# Patient Record
Sex: Female | Born: 1980 | Race: White | Hispanic: No | Marital: Married | Smoking: Never smoker
Health system: Southern US, Community
[De-identification: ages and names within clinical notes are randomized; demographics above are authoritative.]

## PROBLEM LIST (undated history)

## (undated) DIAGNOSIS — F419 Anxiety disorder, unspecified: Secondary | ICD-10-CM

## (undated) DIAGNOSIS — K589 Irritable bowel syndrome without diarrhea: Secondary | ICD-10-CM

## (undated) DIAGNOSIS — G43909 Migraine, unspecified, not intractable, without status migrainosus: Secondary | ICD-10-CM

## (undated) DIAGNOSIS — B001 Herpesviral vesicular dermatitis: Secondary | ICD-10-CM

## (undated) HISTORY — PX: WISDOM TOOTH EXTRACTION: SHX21

## (undated) HISTORY — DX: Herpesviral vesicular dermatitis: B00.1

## (undated) HISTORY — DX: Migraine, unspecified, not intractable, without status migrainosus: G43.909

## (undated) HISTORY — DX: Anxiety disorder, unspecified: F41.9

## (undated) HISTORY — DX: Irritable bowel syndrome, unspecified: K58.9

## (undated) HISTORY — PX: AUGMENTATION MAMMAPLASTY: SUR837

## (undated) HISTORY — PX: MOLE REMOVAL: SHX2046

## (undated) HISTORY — PX: OTHER SURGICAL HISTORY: SHX169

---

## 2011-07-19 DIAGNOSIS — F419 Anxiety disorder, unspecified: Secondary | ICD-10-CM | POA: Insufficient documentation

## 2011-11-12 DIAGNOSIS — Z3480 Encounter for supervision of other normal pregnancy, unspecified trimester: Secondary | ICD-10-CM | POA: Insufficient documentation

## 2011-11-12 HISTORY — DX: Encounter for supervision of other normal pregnancy, unspecified trimester: Z34.80

## 2012-05-05 DIAGNOSIS — M25471 Effusion, right ankle: Secondary | ICD-10-CM

## 2012-05-05 DIAGNOSIS — O4290 Premature rupture of membranes, unspecified as to length of time between rupture and onset of labor, unspecified weeks of gestation: Secondary | ICD-10-CM | POA: Insufficient documentation

## 2012-05-05 HISTORY — DX: Premature rupture of membranes, unspecified as to length of time between rupture and onset of labor, unspecified weeks of gestation: O42.90

## 2012-05-05 HISTORY — DX: Effusion, right ankle: M25.471

## 2012-11-27 ENCOUNTER — Ambulatory Visit (INDEPENDENT_AMBULATORY_CARE_PROVIDER_SITE_OTHER): Payer: Managed Care, Other (non HMO) | Admitting: Adult Health

## 2012-11-27 ENCOUNTER — Encounter: Payer: Self-pay | Admitting: Adult Health

## 2012-11-27 VITALS — BP 118/70 | HR 74 | Temp 98.6°F | Resp 14 | Ht 63.0 in | Wt 152.0 lb

## 2012-11-27 DIAGNOSIS — Z23 Encounter for immunization: Secondary | ICD-10-CM

## 2012-11-27 DIAGNOSIS — F411 Generalized anxiety disorder: Secondary | ICD-10-CM | POA: Insufficient documentation

## 2012-11-27 MED ORDER — PAROXETINE HCL 20 MG PO TABS: 20.0000 mg | ORAL_TABLET | Freq: Every day | ORAL | Status: DC

## 2012-11-27 NOTE — Assessment & Plan Note (Signed)
Flu shot given during clinic.

## 2012-11-27 NOTE — Progress Notes (Signed)
Subjective:    Patient ID: Stacey Barnett, female    DOB: 06-26-1980, 32 y.o.   MRN: [id number]  HPI  Patient is a pleasant 32 y/o female who presents to clinic to establish care. She was followed by [facility] previously while living in [facility]. She reports having slight anxiety issues. She used to take Paxil for her symptoms and felt they were well controlled. She stopped taking medication during her pregnancy. She would like to restart this medication.    Past Medical History  Diagnosis Date  . IBS (irritable bowel syndrome)   . Anxiety      Past Surgical History  Procedure Laterality Date  . Mole removal    . Wisdom tooth extraction    . Bunyons       Family History  Problem Relation Age of Onset  . Stroke Maternal Grandmother   . Diabetes Maternal Grandmother   . Stroke Paternal Grandfather   . Cancer Maternal Grandfather 13    prostate cancer  . Heart disease Paternal Grandmother      History   Social History  . Marital Status: Married    Spouse Name: Josh    Number of Children: 3  . Years of Education: 16   Occupational History  . 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 Smoking status: Never Smoker   . Smokeless tobacco: Never Used  . Alcohol Use: Yes     Comment: Occasionally   . Drug Use: No  . Sexual Activity: Yes    Birth Control/ Protection: Pill   Other Topics Concern  . Not on file   Social History Narrative   [name] grew up in [place], [place]. She attended [employer] in [place], [place] where she obtained her Bachelors Degree in [employer]. She current lives in [place] with her husband and 3 daughters (ages 20, 4 and 6 months). [name] is currently staying at home.  She enjoys going to the movies. She enjoys reading to her children.      Review of Systems  Constitutional: Negative.   HENT: Negative.   Eyes: Negative.   Respiratory:  Negative.   Cardiovascular: Negative.   Gastrointestinal: Negative.   Endocrine: Negative.   Genitourinary: Negative.   Musculoskeletal: Negative.   Skin: Negative.   Allergic/Immunologic: Negative.   Neurological: Negative.   Hematological: Negative.   Psychiatric/Behavioral: The patient is nervous/anxious.        Objective:   Physical Exam  Constitutional: She is oriented to person, place, and time. She appears well-developed and well-nourished. No distress.  HENT:  Head: Normocephalic and atraumatic.  Right Ear: External ear normal.  Left Ear: External ear normal.  Nose: Nose normal.  Mouth/Throat: Oropharynx is clear and moist.  Eyes: Conjunctivae and EOM are normal. Pupils are equal, round, and reactive to light.  Neck: Normal range of motion. Neck supple. No tracheal deviation present. No thyromegaly present.  Cardiovascular: Normal rate, regular rhythm, normal heart sounds and intact distal pulses.  Exam reveals no gallop and no friction rub.   No murmur heard. Pulmonary/Chest: Effort normal and breath sounds normal. No respiratory distress. She has no wheezes. She has no rales.  Abdominal: Soft. Bowel sounds are normal. She exhibits no distension and no mass. There is no tenderness. There is no rebound and no guarding.  Musculoskeletal: Normal range of motion. She exhibits no  edema and no tenderness.  Lymphadenopathy:    She has no cervical adenopathy.  Neurological: She is alert and oriented to person, place, and time. She has normal reflexes. No cranial nerve deficit. Coordination normal.  Skin: Skin is warm and dry.  Psychiatric: She has a normal mood and affect. Her behavior is normal. Judgment and thought content normal.    BP 118/70  Pulse 74  Temp(Src) 98.6 F (37 C)  Resp 14  SpO2 98%       Assessment & Plan:

## 2012-11-27 NOTE — Progress Notes (Signed)
Pre visit review using our clinic review tool, if applicable. No additional management support is needed unless otherwise documented below in the visit note. 

## 2012-11-27 NOTE — Assessment & Plan Note (Signed)
Patient has hx of anxiety and has taken paxil in the past with good results. She stopped taking during pregnancy. Now she is ready to restart. Start Paxil 20 mg daily. May increase to 30 mg in 1 week. This was the dose she was previously on.

## 2012-11-27 NOTE — Patient Instructions (Signed)
   Thank you for choosing  at [facility] for your health care needs.  Please have your labs drawn at your earliest convenience.  The results will be available through MyChart for your convenience. Please remember to activate this. The activation code is located at the end of this form.  I will request your medical records from [facility].  Your prescription for Paxil has been sent to [facility].  Please feel free to call with any questions or concerns.

## 2012-12-18 ENCOUNTER — Other Ambulatory Visit: Payer: Self-pay | Admitting: Adult Health

## 2012-12-18 ENCOUNTER — Telehealth: Payer: Self-pay | Admitting: Adult Health

## 2012-12-18 MED ORDER — NORETHINDRONE ACET-ETHINYL EST 1-20 MG-MCG PO TABS: 1.0000 | ORAL_TABLET | Freq: Every day | ORAL | Status: DC

## 2012-12-18 NOTE — Telephone Encounter (Signed)
Sent in prescription for Junel. Called patient and left message to call back in the morning to let me know what dosage of valtrex she was on and how often she takes this.

## 2012-12-18 NOTE — Telephone Encounter (Signed)
Pt says she forgot to bring her birth control when she came. The name is Junel 1-20 generic for lolesterin 1-20 is the name of her birth control. Pt is asking for valtrex for her fever blisters as well. Pt uses [facility] on [address]

## 2012-12-19 ENCOUNTER — Other Ambulatory Visit: Payer: Self-pay | Admitting: Adult Health

## 2012-12-19 MED ORDER — VALACYCLOVIR HCL 1 G PO TABS: ORAL_TABLET | ORAL | Status: DC

## 2012-12-19 NOTE — Telephone Encounter (Signed)
See previous message

## 2012-12-19 NOTE — Telephone Encounter (Signed)
Pt called back stated she does not know the dosage of valtrex and she only uses it once or twice a year when she gets a fever blister

## 2012-12-19 NOTE — Progress Notes (Signed)
Patient advised and states an understanding.

## 2012-12-22 NOTE — Telephone Encounter (Signed)
Pt advised to bring/mail copy of [facility] labs to the office , so that we may scan results in epic, pt states an understanding and seh has enough Paxil for 2-3 more weeks and will let us know when she needs more medication.

## 2012-12-22 NOTE — Telephone Encounter (Signed)
Pt states she has lab work she received from [facility].  Would like nurse to call her so she can verbally give the results.  Would like R. Rey to call in more of the medication she started (no name given).  Pt states it is working well and she would like to continue.

## 2013-02-08 ENCOUNTER — Other Ambulatory Visit: Payer: Self-pay | Admitting: *Deleted

## 2013-02-09 MED ORDER — PAROXETINE HCL 20 MG PO TABS: 20.0000 mg | ORAL_TABLET | Freq: Every day | ORAL | Status: DC

## 2013-02-12 ENCOUNTER — Other Ambulatory Visit: Payer: Self-pay | Admitting: *Deleted

## 2013-02-12 MED ORDER — NORETHINDRONE ACET-ETHINYL EST 1-20 MG-MCG PO TABS: 1.0000 | ORAL_TABLET | Freq: Every day | ORAL | Status: DC

## 2013-04-17 ENCOUNTER — Other Ambulatory Visit: Payer: Self-pay | Admitting: Adult Health

## 2013-05-07 ENCOUNTER — Telehealth: Payer: Self-pay | Admitting: Adult Health

## 2013-05-07 NOTE — Telephone Encounter (Signed)
Appt scheduled with Raquel 05/08/13, just FYI

## 2013-05-07 NOTE — Telephone Encounter (Signed)
Patient Information:  Caller Name: [name]  Phone: [phone]  Patient: Stacey Barnett, Stacey Barnett  Gender: Female  DOB: 04/07/1980  Age: [age] Years  PCP: Orville Governey, Raquel  Pregnant: No  Office Follow Up:  Does the office need to follow up with this patient?: No  Instructions For The Office: N/A  RN Note:  Pt agrees to OV and call back information  Symptoms  Reason For Call & Symptoms: Pt reports that she has a history of IBS.  Pt reports that she was constipated.  She took a Laxative on 4/17.  Pt reports that she was able to pass stool.  Pt is calling about having bright red blood in stool x 3 after having multiple BM's.  Pt reports that blood has since stopped. Pt reports that she does not have any abd pain or rectal pain.  Pt denies any known history hemmroids.  Reviewed Health History In EMR: Yes  Reviewed Medications In EMR: Yes  Reviewed Allergies In EMR: Yes  Reviewed Surgeries / Procedures: Yes  Date of Onset of Symptoms: 05/04/2013 OB / GYN:  LMP: 04/23/2013  Guideline(s) Used:  Constipation  Rectal Bleeding  Disposition Per Guideline:   See Within 2 Weeks in Office  Reason For Disposition Reached:   Rectal bleeding is minimal (e.g., blood just on toilet paper, a few drops in toilet bowl)  Advice Given:  Warm SITZ Baths Twice a Day:   Sit in a warm saline bath for 20 minutes 2 times daily to cleanse the rectal area and to promote healing.  You can add 2 ounces (57 grams) of table salt or baking soda to each tub of water.  Call Back If:  Bleeding increases in amount  You become worse.  Patient Will Follow Care Advice:  YES  Appointment Scheduled:  05/08/2013 16:15:00 Appointment Scheduled Provider:  Orville Governey, Raquel

## 2013-05-08 ENCOUNTER — Encounter: Payer: Self-pay | Admitting: Adult Health

## 2013-05-08 ENCOUNTER — Ambulatory Visit (INDEPENDENT_AMBULATORY_CARE_PROVIDER_SITE_OTHER): Payer: Managed Care, Other (non HMO) | Admitting: Adult Health

## 2013-05-08 VITALS — BP 84/64 | HR 70 | Temp 97.6°F | Resp 14 | Wt 139.0 lb

## 2013-05-08 DIAGNOSIS — K921 Melena: Secondary | ICD-10-CM

## 2013-05-08 DIAGNOSIS — R5383 Other fatigue: Secondary | ICD-10-CM

## 2013-05-08 DIAGNOSIS — R5381 Other malaise: Secondary | ICD-10-CM

## 2013-05-08 DIAGNOSIS — K5900 Constipation, unspecified: Secondary | ICD-10-CM

## 2013-05-08 HISTORY — DX: Other fatigue: R53.83

## 2013-05-08 NOTE — Progress Notes (Signed)
Pre visit review using our clinic review tool, if applicable. No additional management support is needed unless otherwise documented below in the visit note. 

## 2013-05-08 NOTE — Progress Notes (Signed)
Subjective:    Patient ID: Stacey SimmersJessica Barnett, female    DOB: 12/13/1980, 33 y.o.   MRN: [id number]  HPI Stacey BumpsJessica  Is a pleasant 33 year old female with history of IBS, mainly constipation, who reports to clinic for evaluation after an episode of bloody stool after taking a laxative. She reports feeling like she needed to have a BM but was too constipated. She took a woman's laxative on Saturday evening. She reports that she was awakened early Sunday morning with significant abdominal cramping, and urges to move her bowels. She reports having a BM and noticing some blood in the stool. She denies fever or abdominal pain other than the cramping. This was an isolated incident. She has not seen any more blood from her stool. She has been monitoring this. She is feeling slightly fatigued. Reports having a very hard time with constipation but does not take anything to help keep her regular.  Past Medical History  Diagnosis Date  . IBS (irritable bowel syndrome)   . Anxiety      Current Outpatient Prescriptions on File Prior to Visit  Medication Sig Dispense Refill  . Biotin 1 MG CAPS Take by mouth.      . Multiple Vitamin (MULTIVITAMIN) tablet Take 1 tablet by mouth daily.      . norethindrone-ethinyl estradiol (MICROGESTIN,JUNEL,LOESTRIN) 1-20 MG-MCG tablet Take 1 tablet by mouth daily.  1 Package  11  . PARoxetine (PAXIL) 20 MG tablet Take 1 tablet by mouth  daily  90 tablet  0  . valACYclovir (VALTREX) 1000 MG tablet At the onset of cold sores: take 2 tablets every 12 hours for 1 day.  20 tablet  3   No current facility-administered medications on file prior to visit.    Review of Systems  Constitutional: Positive for fatigue. Negative for fever, chills and appetite change.  HENT: Negative.   Eyes: Negative.   Respiratory: Negative.   Cardiovascular: Negative.   Gastrointestinal: Positive for constipation and blood in stool (episode x 1 on Sunday. No other episodes). Negative for nausea,  vomiting, abdominal pain, diarrhea, abdominal distention, anal bleeding and rectal pain.  Endocrine: Negative.   Genitourinary: Negative.   Musculoskeletal: Negative.   Skin: Negative.   Allergic/Immunologic: Negative.   Neurological: Negative.   Hematological: Negative.   Psychiatric/Behavioral: Negative.        Objective:   Physical Exam  Constitutional: She is oriented to person, place, and time. No distress.  HENT:  Head: Normocephalic and atraumatic.  Eyes: Conjunctivae and EOM are normal.  Neck: Normal range of motion. Neck supple.  Cardiovascular: Normal rate and regular rhythm.   Pulmonary/Chest: Effort normal. No respiratory distress.  Abdominal: Soft. Bowel sounds are normal. She exhibits no distension and no mass. There is no tenderness. There is no rebound and no guarding.  Genitourinary: Rectum normal. Rectal exam shows no external hemorrhoid, no internal hemorrhoid, no fissure, no mass, no tenderness and anal tone normal. Guaiac negative stool.  Musculoskeletal: Normal range of motion.  Neurological: She is alert and oriented to person, place, and time. She has normal reflexes. Coordination normal.  Skin: Skin is warm and dry.  Psychiatric: She has a normal mood and affect. Her behavior is normal. Judgment and thought content normal.      Assessment & Plan:   1. Blood in stool Single episode after taking laxative. She experienced severe cramping and discomfort. Advised pt not to take these harsh laxatives. Rectum examined without any external or internal hemorrhoids noted. Hemoccult negative.  2. Constipation Hx of IBS with constipation being the predominant symptom. She does not drink enough fluids. Does not take anything to maintain regularity. She will start Miralax daily in the morning. Drink at least 8 glasses of water daily. She will begin this on Sunday to give bowels a rest from previous incident. Send for KUB to evaluate if large amount of stool within  colon. - DG Abd 1 View; Future  3. Fatigue She has 3 small children who keep her very active. Her blood pressure is on the low side which can also be causing her to feel tired. Check labs to evaluate hemoglobin.  - CBC with Differential

## 2013-05-09 LAB — CBC WITH DIFFERENTIAL/PLATELET
BASOS PCT: 1 % (ref 0.0–3.0)
Basophils Absolute: 0.1 10*3/uL (ref 0.0–0.1)
EOS ABS: 1 10*3/uL — AB (ref 0.0–0.7)
Eosinophils Relative: 12.5 % — ABNORMAL HIGH (ref 0.0–5.0)
HEMATOCRIT: 37.8 % (ref 36.0–46.0)
Hemoglobin: 12.9 g/dL (ref 12.0–15.0)
Lymphocytes Relative: 28.5 % (ref 12.0–46.0)
Lymphs Abs: 2.3 10*3/uL (ref 0.7–4.0)
MCHC: 34.1 g/dL (ref 30.0–36.0)
MCV: 89.4 fl (ref 78.0–100.0)
MONO ABS: 0.4 10*3/uL (ref 0.1–1.0)
Monocytes Relative: 4.8 % (ref 3.0–12.0)
NEUTROS ABS: 4.3 10*3/uL (ref 1.4–7.7)
Neutrophils Relative %: 53.2 % (ref 43.0–77.0)
Platelets: 210 10*3/uL (ref 150.0–400.0)
RBC: 4.22 Mil/uL (ref 3.87–5.11)
RDW: 13.3 % (ref 11.5–14.6)
WBC: 8 10*3/uL (ref 4.5–10.5)

## 2013-05-10 ENCOUNTER — Encounter: Payer: Self-pay | Admitting: *Deleted

## 2013-06-15 ENCOUNTER — Other Ambulatory Visit: Payer: Self-pay | Admitting: Adult Health

## 2013-08-17 ENCOUNTER — Other Ambulatory Visit: Payer: Self-pay | Admitting: Adult Health

## 2013-09-11 ENCOUNTER — Telehealth: Payer: Self-pay | Admitting: Adult Health

## 2013-09-11 NOTE — Telephone Encounter (Signed)
Pt dropped off staff medical report form to be filled out. Form is in Raqual's box/msn

## 2013-09-13 NOTE — Telephone Encounter (Signed)
Placed in raquel's folder to be completed.

## 2013-12-17 ENCOUNTER — Other Ambulatory Visit: Payer: Self-pay | Admitting: *Deleted

## 2013-12-17 MED ORDER — NORETHINDRONE ACET-ETHINYL EST 1-20 MG-MCG PO TABS: 1.0000 | ORAL_TABLET | Freq: Every day | ORAL | Status: DC

## 2013-12-18 ENCOUNTER — Encounter: Payer: Managed Care, Other (non HMO) | Admitting: Nurse Practitioner

## 2013-12-27 ENCOUNTER — Encounter: Payer: Managed Care, Other (non HMO) | Admitting: Nurse Practitioner

## 2014-01-25 ENCOUNTER — Other Ambulatory Visit: Payer: Self-pay

## 2014-01-25 NOTE — Telephone Encounter (Signed)
Patient needs to be seen before refill given. She has cancelled 2 appointments 12/18/13 and 12/27/13.

## 2014-01-25 NOTE — Telephone Encounter (Signed)
Electronic Rx request for Paroxetine received. Medication last filled 06/15/13 and patient's last office visit was 05/08/13. Please advise if it's okay to refill Rx.

## 2014-01-29 ENCOUNTER — Ambulatory Visit (INDEPENDENT_AMBULATORY_CARE_PROVIDER_SITE_OTHER): Payer: Managed Care, Other (non HMO) | Admitting: Nurse Practitioner

## 2014-01-29 ENCOUNTER — Other Ambulatory Visit: Payer: Self-pay | Admitting: *Deleted

## 2014-01-29 ENCOUNTER — Encounter: Payer: Self-pay | Admitting: Nurse Practitioner

## 2014-01-29 VITALS — BP 100/58 | HR 87 | Temp 98.0°F | Resp 12 | Ht 63.0 in | Wt 142.8 lb

## 2014-01-29 DIAGNOSIS — Z139 Encounter for screening, unspecified: Secondary | ICD-10-CM

## 2014-01-29 LAB — POCT URINALYSIS DIPSTICK
Bilirubin, UA: NEGATIVE
Glucose, UA: NEGATIVE
Ketones, UA: NEGATIVE
Leukocytes, UA: NEGATIVE
Nitrite, UA: NEGATIVE
PH UA: 5.5
PROTEIN UA: NEGATIVE
Spec Grav, UA: 1.02
Urobilinogen, UA: 0.2

## 2014-01-29 MED ORDER — NORETHIN ACE-ETH ESTRAD-FE 1-20 MG-MCG(24) PO TABS: 1.0000 | ORAL_TABLET | Freq: Every day | ORAL | Status: DC

## 2014-01-29 MED ORDER — PAROXETINE HCL 20 MG PO TABS: ORAL_TABLET | ORAL | Status: DC

## 2014-01-29 NOTE — Progress Notes (Signed)
Subjective:    Patient ID: Stacey SimmersJessica Barnett, female    DOB: 04/02/1980, 34 y.o.   MRN: [id number]  HPI  Ms. Rouse is here for a CPE and medication refill.   1) Health Maintenance-   Diet- No formal diet  Exercise- No formal exercise  Immunizations- UTD  Mammogram- Not high risk, denies family hx of breast or ovarian cancer  Pap- Unsure of last pap child is 20 months, possibly within this time frame, negative results in past.   Eye Exam- Denies issues, not been to formal eye Dr.   Garen Gramsental Exam- Regularly  [facility] for wellness in 2015   2) Anxiety- Controlled with Paxil 20 mg  Complains of night sweats since she has been off of paxil for a few days. Ran out Friday.   LMP- 01/28/2013, usually lasts 3-5 days Sexually active with 1 female partner.   Review of Systems  Constitutional: Positive for diaphoresis. Negative for fever, chills, fatigue and unexpected weight change.  HENT: Negative for tinnitus and trouble swallowing.   Eyes: Negative for visual disturbance.  Respiratory: Negative for cough, chest tightness, shortness of breath and wheezing.   Cardiovascular: Negative for chest pain, palpitations and leg swelling.  Gastrointestinal: Negative for nausea, vomiting, abdominal pain, diarrhea, constipation and blood in stool.  Genitourinary: Negative for vaginal discharge and vaginal pain.  Skin: Negative for rash.  Neurological: Negative for dizziness, weakness and headaches.  Psychiatric/Behavioral: Negative for suicidal ideas and sleep disturbance. The patient is nervous/anxious.    Past Medical History  Diagnosis Date  . IBS (irritable bowel syndrome)   . Anxiety     History   Social History  . Marital Status: Married    Spouse Name: Josh    Number of Children: 3  . Years of Education: 16   Occupational History  . 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 Smoking status: Never  Smoker   . Smokeless tobacco: Never Used  . Alcohol Use: Yes     Comment: Occasionally   . Drug Use: No  . Sexual Activity: Yes    Birth Control/ Protection: Pill   Other Topics Concern  . Not on file   Social History Narrative   [name] grew up in [place], [place]. She attended [employer] in [place], [place] where she obtained her Bachelors Degree in [employer]. She current lives in [place] with her husband and 3 daughters (ages 406, 4 and 6 months). [name] is currently staying at home.  She enjoys going to the movies. She enjoys reading to her children.     Past Surgical History  Procedure Laterality Date  . Mole removal    . Wisdom tooth extraction    . Bunyons      Family History  Problem Relation Age of Onset  . Stroke Maternal Grandmother   . Diabetes Maternal Grandmother   . Stroke Paternal Grandfather   . Cancer Maternal Grandfather [id number]    prostate cancer  . Heart disease Paternal Grandmother     No Known Allergies  Current Outpatient Prescriptions on File Prior to Visit  Medication Sig Dispense Refill  . Biotin 1 MG CAPS Take by mouth.    . Multiple Vitamin (MULTIVITAMIN) tablet Take 1 tablet by mouth daily.    . valACYclovir (VALTREX) 1000 MG tablet At the onset of cold sores: take 2  tablets every 12 hours for 1 day. 20 tablet 3   No current facility-administered medications on file prior to visit.       Objective:   Physical Exam  Constitutional: She is oriented to person, place, and time. She appears well-developed and well-nourished. No distress.  HENT:  Head: Normocephalic and atraumatic.  Right Ear: External ear normal.  Left Ear: External ear normal.  Nose: Nose normal.  Mouth/Throat: Oropharynx is clear and moist. No oropharyngeal exudate.  Eyes: Conjunctivae and EOM are normal. Pupils are equal, round, and reactive to light. Right eye exhibits no discharge. Left eye exhibits no discharge. No scleral icterus.  Neck: Normal range of  motion. Neck supple. No thyromegaly present.  Cardiovascular: Normal rate, regular rhythm, normal heart sounds and intact distal pulses.  Exam reveals no gallop and no friction rub.   No murmur heard. Pulmonary/Chest: Effort normal and breath sounds normal. No respiratory distress. She has no wheezes. She has no rales. She exhibits no tenderness.  Abdominal: Soft. Bowel sounds are normal. She exhibits no distension and no mass. There is no tenderness. There is no rebound and no guarding.  Musculoskeletal: Normal range of motion. She exhibits no edema or tenderness.  Lymphadenopathy:    She has no cervical adenopathy.  Neurological: She is alert and oriented to person, place, and time. She has normal reflexes. No cranial nerve deficit. She exhibits normal muscle tone. Coordination normal.  Skin: Skin is warm and dry. No rash noted. She is not diaphoretic. No erythema. No pallor.  Psychiatric: She has a normal mood and affect. Her behavior is normal. Judgment and thought content normal.   BP 100/58 mmHg  Pulse 87  Temp(Src) 98 F (36.7 C) (Oral)  Resp 12  Ht  (1.6 m)  Wt 142 lb 12.8 oz (64.774 kg)  BMI 25.30 kg/m2  SpO2 98%     Assessment & Plan:

## 2014-01-29 NOTE — Progress Notes (Signed)
Pre visit review using our clinic review tool, if applicable. No additional management support is needed unless otherwise documented below in the visit note. 

## 2014-01-29 NOTE — Patient Instructions (Signed)
Please provide us with a copy of your most recent labs so we can update our system.  Call us if you need us in the mean time.

## 2014-03-28 ENCOUNTER — Telehealth: Payer: Self-pay | Admitting: Nurse Practitioner

## 2014-03-28 ENCOUNTER — Other Ambulatory Visit: Payer: Self-pay

## 2014-03-28 DIAGNOSIS — Z760 Encounter for issue of repeat prescription: Secondary | ICD-10-CM

## 2014-03-28 MED ORDER — NORETHIN ACE-ETH ESTRAD-FE 1-20 MG-MCG(24) PO TABS
1.0000 | ORAL_TABLET | Freq: Every day | ORAL | Status: DC
Start: 2014-03-28 — End: 2014-12-24

## 2014-03-28 NOTE — Telephone Encounter (Signed)
Rx refilled.

## 2014-03-28 NOTE — Telephone Encounter (Signed)
Pt needs refill on [place] 24 FE. Please send to [facility] rx/msn

## 2014-06-27 ENCOUNTER — Other Ambulatory Visit: Payer: Self-pay | Admitting: Nurse Practitioner

## 2014-07-23 ENCOUNTER — Telehealth: Payer: Self-pay | Admitting: Nurse Practitioner

## 2014-07-23 NOTE — Telephone Encounter (Signed)
Pt dropped off Health Exam Certificate Form to be filled out. Form in Doss box/msn

## 2014-07-25 ENCOUNTER — Telehealth: Payer: Self-pay | Admitting: Nurse Practitioner

## 2014-07-25 ENCOUNTER — Ambulatory Visit: Payer: Managed Care, Other (non HMO) | Admitting: Nurse Practitioner

## 2014-07-25 DIAGNOSIS — Z0289 Encounter for other administrative examinations: Secondary | ICD-10-CM

## 2014-07-25 NOTE — Telephone Encounter (Signed)
She needs to have tb test and I need her immunization records since we only have her flu vaccination. I was also going to do a vision test since she has not had a formal eye Dr. Visit on our records.

## 2014-07-25 NOTE — Telephone Encounter (Signed)
Pt called to cancel appt. Pt swelling is going down. Please advise if okay to cancel appt. Pt also stated that she brought in a form to be filled out and was told that she needed to have a physical before it could be filled out. Pt advise that she has had a physical less than a year ago. Please advise pt/msn

## 2014-07-25 NOTE — Telephone Encounter (Signed)
Pt was called to inform her Doss request. Unable to leave msg due to no voice mail setup.msn

## 2014-07-26 NOTE — Telephone Encounter (Signed)
Called pt, no answer, unable to leave message due to vm not set up.

## 2014-07-29 ENCOUNTER — Other Ambulatory Visit: Payer: Self-pay | Admitting: Nurse Practitioner

## 2014-07-30 NOTE — Telephone Encounter (Signed)
Last OV 1.12.16. No Show 7.7.16 appoint.  Please advise refill

## 2014-07-31 NOTE — Telephone Encounter (Signed)
Informed pt that she would need TB test and eye test. Pt stated that she is getting ready to have some labs for her husband work at [employer] for [employer] purposes and will contact us when she gets that and make an appt for immunizations if necessary.

## 2014-11-05 ENCOUNTER — Ambulatory Visit: Payer: Managed Care, Other (non HMO)

## 2014-11-12 ENCOUNTER — Ambulatory Visit (INDEPENDENT_AMBULATORY_CARE_PROVIDER_SITE_OTHER): Payer: Managed Care, Other (non HMO) | Admitting: Surgical

## 2014-11-12 DIAGNOSIS — Z111 Encounter for screening for respiratory tuberculosis: Secondary | ICD-10-CM | POA: Diagnosis not present

## 2014-11-12 DIAGNOSIS — R002 Palpitations: Secondary | ICD-10-CM

## 2014-11-12 NOTE — Progress Notes (Signed)
Patient came in today for TB skin test and vision test. TB placed in right forearm. Vision left eye 20/15 right eyes 20/15 both eyes 20/13 without corrective lenses.

## 2014-11-14 ENCOUNTER — Ambulatory Visit: Payer: Managed Care, Other (non HMO)

## 2014-11-14 LAB — TB SKIN TEST
INDURATION: 0 mm
TB SKIN TEST: NEGATIVE

## 2014-11-20 ENCOUNTER — Telehealth: Payer: Self-pay | Admitting: Nurse Practitioner

## 2014-11-20 NOTE — Telephone Encounter (Signed)
Closed

## 2014-11-21 ENCOUNTER — Ambulatory Visit (INDEPENDENT_AMBULATORY_CARE_PROVIDER_SITE_OTHER): Payer: Managed Care, Other (non HMO) | Admitting: Family Medicine

## 2014-11-21 ENCOUNTER — Encounter: Payer: Self-pay | Admitting: Family Medicine

## 2014-11-21 VITALS — BP 112/66 | HR 75 | Temp 98.3°F | Ht 63.0 in | Wt 146.2 lb

## 2014-11-21 DIAGNOSIS — Z8619 Personal history of other infectious and parasitic diseases: Secondary | ICD-10-CM | POA: Diagnosis not present

## 2014-11-21 DIAGNOSIS — Z3200 Encounter for pregnancy test, result unknown: Secondary | ICD-10-CM | POA: Diagnosis not present

## 2014-11-21 DIAGNOSIS — L720 Epidermal cyst: Secondary | ICD-10-CM | POA: Diagnosis not present

## 2014-11-21 LAB — POCT URINE PREGNANCY: PREG TEST UR: NEGATIVE

## 2014-11-21 MED ORDER — DOXYCYCLINE HYCLATE 100 MG PO TABS: 100.0000 mg | ORAL_TABLET | Freq: Two times a day (BID) | ORAL | Status: DC

## 2014-11-21 MED ORDER — VALACYCLOVIR HCL 1 G PO TABS: ORAL_TABLET | ORAL | Status: DC

## 2014-11-21 NOTE — Patient Instructions (Addendum)
Nice to meet you. Your cyst was infected and drained infected material. We will start you on an antibiotic to cover this. Please continue to monitor for further swelling. If you have recurrence of swelling, or you develop redness, fever, pain with eye movement, redness of her eyes, vision changes, or any new symptoms or change in symptoms please seek medical attention immediately. Please avoid becoming pregnant while on doxycycline.

## 2014-11-21 NOTE — Progress Notes (Signed)
Patient ID: Stacey Barnett, female   DOB: 04/15/1980, 34 y.o.   MRN: [id number]  Stacey AlarEric Sonnenberg, MD Phone: [phone]  Stacey SimmersJessica Barnett is a 34 y.o. female who presents today for same-day visit.  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 She notes she has 1 on the central anterior hairline. She notes this became tender on Monday and then ruptured on Tuesday. It had drained material yesterday as well. She notes that there is some swelling around it and then her forehead had some swelling as well. Then she noted her right eyelid became swollen as well. This occurred yesterday and this morning. Though patient reports the swelling has resolved at this time. She had no mouth, tongue, or throat swelling with this. She had no erythema of the forehead or eyelids. She had no eye redness or pain with motion of eyes. She had no blurry vision with this. She does note she used alcohol and Neosporin on this. She has had cysts removed previously by plastic surgeon in [place]. She denies fevers. She notes she feels well.  Patient additionally notes that she has intermittent cold sores. She notes she gets these areas on the edge of her lips infrequently. She notes that when she does get these she takes Valtrex 2 g every 12 hours for 1 day and this resolves them quickly. She notes her prescription is out of date at this time and she needs a refill.  PMH: nonsmoker.   ROS see history of present illness  Objective  Physical Exam Filed Vitals:   11/21/14 0810  BP: 112/66  Pulse: 75  Temp: 98.3 F (36.8 C)    BP Readings from Last 3 Encounters:  11/21/14 112/66  01/29/14 100/58  05/08/13 84/64   Wt Readings from Last 3 Encounters:  11/21/14 146 lb 3.2 oz (66.316 kg)  01/29/14 142 lb 12.8 oz (64.774 kg)  05/08/13 139 lb (63.05 kg)    Physical Exam  Constitutional: She is well-developed, well-nourished, and in no distress.  HENT:  Right Ear: External ear normal.  Left Ear:  External ear normal.  Mouth/Throat: Oropharynx is clear and moist. No oropharyngeal exudate.  1 cm cyst that has crusting and scab over the top of it, there is no drainage from it, it is minimally tender, there is no surrounding erythema, there is no scalp erythema or swelling, there is no swelling or erythema of the forehead or eyelids, normal sclera, normal ocular movements with no discomfort, pupils equal and reactive to light, normal TMs bilaterally  Neck: Neck supple.  Cardiovascular: Normal rate, regular rhythm and normal heart sounds.  Exam reveals no gallop and no friction rub.   No murmur heard. Pulmonary/Chest: Effort normal and breath sounds normal. No respiratory distress. She has no wheezes. She has no rales.  Lymphadenopathy:    She has no cervical adenopathy.  Skin: She is not diaphoretic.     Assessment/Plan: Please see individual problem list.  Epidermal cyst Inflamed and ruptured epidermal cyst. There is no area of fluctuance that could be opened at this time. She does not have any surrounding cellulitis. The swelling from this morning and yesterday appears to have resolved at this time and is likely related to inflammatory reaction. This does not appear consistent with a cellulitic reaction. Exam of her eyes does not reveal anything consistent with preseptal or orbital cellulitis. When discussing antibiotics to cover this the patient notes that she had not had a period last month. She does report  that she has irregular periods on her birth control pills and some months she will have a period and other months she will not have a period. Urine pregnancy test was negative. We will treat with doxycycline to cover for potential infection of the cyst. Did discuss the likelihood that this cyst would come back. She will monitor this and once it is healed she will call for referral to plastic surgery for removal. She was given return precautions.  H/O cold sores Patient does not have a  current cold sore. She does have a history of these in the past and she has taken Valtrex for these. Her current prescription is out. She denies any renal dysfunction. We will refill her Valtrex.    Orders Placed This Encounter  Procedures  . POCT urine pregnancy    Meds ordered this encounter  Medications  . DISCONTD: doxycycline (VIBRA-TABS) 100 MG tablet    Sig: Take 1 tablet (100 mg total) by mouth 2 (two) times daily.    Dispense:  14 tablet    Refill:  0  . doxycycline (VIBRA-TABS) 100 MG tablet    Sig: Take 1 tablet (100 mg total) by mouth 2 (two) times daily.    Dispense:  14 tablet    Refill:  0  . valACYclovir (VALTREX) 1000 MG tablet    Sig: At the onset of cold sores: take 2 tablets every 12 hours for 1 day.    Dispense:  20 tablet    Refill:  3   Stacey Barnett

## 2014-11-21 NOTE — Assessment & Plan Note (Signed)
Patient does not have a current cold sore. She does have a history of these in the past and she has taken Valtrex for these. Her current prescription is out. She denies any renal dysfunction. We will refill her Valtrex.

## 2014-11-21 NOTE — Progress Notes (Signed)
Pre visit review using our clinic review tool, if applicable. No additional management support is needed unless otherwise documented below in the visit note. 

## 2014-11-21 NOTE — Assessment & Plan Note (Addendum)
Inflamed and ruptured epidermal cyst. There is no area of fluctuance that could be opened at this time. She does not have any surrounding cellulitis. The swelling from this morning and yesterday appears to have resolved at this time and is likely related to inflammatory reaction. This does not appear consistent with a cellulitic reaction. Exam of her eyes does not reveal anything consistent with preseptal or orbital cellulitis. When discussing antibiotics to cover this the patient notes that she had not had a period last month. She does report that she has irregular periods on her birth control pills and some months she will have a period and other months she will not have a period. Urine pregnancy test was negative. We will treat with doxycycline to cover for potential infection of the cyst. Did discuss the likelihood that this cyst would come back. She will monitor this and once it is healed she will call for referral to plastic surgery for removal. She was given return precautions.

## 2014-11-27 ENCOUNTER — Other Ambulatory Visit: Payer: Self-pay | Admitting: Nurse Practitioner

## 2014-12-24 ENCOUNTER — Other Ambulatory Visit: Payer: Self-pay | Admitting: Nurse Practitioner

## 2014-12-26 NOTE — Telephone Encounter (Signed)
Please advise refill, you have not seen since January, patient saw Dr. Birdie SonsSonnenberg for an acute reason recently.  Thanks?

## 2015-06-11 ENCOUNTER — Telehealth: Payer: Self-pay | Admitting: *Deleted

## 2015-06-11 MED ORDER — PAROXETINE HCL 20 MG PO TABS: ORAL_TABLET | ORAL | Status: DC

## 2015-06-11 NOTE — Telephone Encounter (Signed)
Refill given. Patient needs appointment to discuss anxiety for further refills.

## 2015-06-11 NOTE — Telephone Encounter (Signed)
Patient has requested to have a refill for her Paroxetine [facility] [address]

## 2015-06-12 MED ORDER — PAROXETINE HCL 20 MG PO TABS: ORAL_TABLET | ORAL | Status: DC

## 2015-06-12 NOTE — Telephone Encounter (Signed)
[facility] and has already been sent out.  Patient is aware.

## 2015-06-12 NOTE — Telephone Encounter (Signed)
[facility] has [facility] [address] [facility]

## 2015-07-02 ENCOUNTER — Ambulatory Visit: Payer: Managed Care, Other (non HMO) | Admitting: Primary Care

## 2015-07-15 ENCOUNTER — Ambulatory Visit: Payer: Managed Care, Other (non HMO) | Admitting: Primary Care

## 2015-07-25 ENCOUNTER — Encounter: Payer: Self-pay | Admitting: Primary Care

## 2015-07-25 ENCOUNTER — Ambulatory Visit (INDEPENDENT_AMBULATORY_CARE_PROVIDER_SITE_OTHER): Payer: Managed Care, Other (non HMO) | Admitting: Primary Care

## 2015-07-25 VITALS — BP 102/64 | HR 91 | Temp 98.1°F | Ht 63.0 in | Wt 146.0 lb

## 2015-07-25 DIAGNOSIS — Z8619 Personal history of other infectious and parasitic diseases: Secondary | ICD-10-CM

## 2015-07-25 DIAGNOSIS — F411 Generalized anxiety disorder: Secondary | ICD-10-CM | POA: Diagnosis not present

## 2015-07-25 MED ORDER — PAROXETINE HCL 20 MG PO TABS: ORAL_TABLET | ORAL | Status: DC

## 2015-07-25 NOTE — Assessment & Plan Note (Signed)
Well managed on Paxil 20 mg.  Refills provided today. Denies SI/HI.

## 2015-07-25 NOTE — Progress Notes (Signed)
Pre visit review using our clinic review tool, if applicable. No additional management support is needed unless otherwise documented below in the visit note. 

## 2015-07-25 NOTE — Assessment & Plan Note (Signed)
Uses Valtrex PRN with improvement. Breakouts are infrequent. No refills needed today.

## 2015-07-25 NOTE — Progress Notes (Signed)
Subjective:    Patient ID: Stacey SimmersJessica Elias, female    DOB: 01/12/1981, 35 y.o.   MRN: [id number]  HPI  [name] is a 35 year old female who presents today to transfer care from [facility] [facility].   1) Generalized Anxiety Disorder: Diagnosed years ago and is currently managed on Paroxetine 20 mg. She feels well managed on Paroxetine for which she's been taking for 1+ years. She is requesting refills today. Denies panic attacks, SI/HI. She does experience occasional headaches.   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 She will experience outbreaks infrequently, 1-2 annually. The valtrex will clear up breakouts quickly.   Review of Systems  Respiratory: Negative for shortness of breath.   Cardiovascular: Negative for chest pain.  Neurological: Positive for headaches.  Psychiatric/Behavioral: Negative for suicidal ideas. The patient is not nervous/anxious.        Past Medical History  Diagnosis Date  . IBS (irritable bowel syndrome)   . Anxiety   . Herpes labialis      Social History   Social History  . Marital Status: Married    Spouse Name: Sharia ReeveJosh  . Number of Children: 3  . Years of Education: 16   Occupational History  . 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 Smoking status: Never Smoker   . Smokeless tobacco: Never Used  . Alcohol Use: Yes     Comment: Occasionally   . Drug Use: No  . Sexual Activity: Yes    Birth Control/ Protection: Pill   Other Topics Concern  . Not on file   Social History Narrative   [name] grew up in [place], [place]. She attended [employer] in [place], [place] where she obtained her Bachelors Degree in [employer] Education.    Works teaching second grade.   Enjoys going to the movies, getting pedicures.     Past Surgical History  Procedure Laterality Date  . Mole removal    . Wisdom tooth extraction     . Bunyons      Family History  Problem Relation Age of Onset  . Stroke Maternal Grandmother   . Diabetes Maternal Grandmother   . Stroke Paternal Grandfather   . Cancer Maternal Grandfather 2075    prostate cancer  . Heart disease Paternal Grandmother     No Known Allergies  Current Outpatient Prescriptions on File Prior to Visit  Medication Sig Dispense Refill  . LOMEDIA 24 FE 1-20 MG-MCG(24) tablet Take 1 tablet by mouth  daily 1 Package 11  . Multiple Vitamin (MULTIVITAMIN) tablet Take 1 tablet by mouth daily.    . valACYclovir (VALTREX) 1000 MG tablet At the onset of cold sores: take 2 tablets every 12 hours for 1 day. 20 tablet 3   No current facility-administered medications on file prior to visit.    BP 102/64 mmHg  Pulse 91  Temp(Src) 98.1 F (36.7 C) (Oral)  Ht 5\' 3"  (1.6 m)  Wt 146 lb (66.225 kg)  BMI 25.87 kg/m2  SpO2 97%  LMP 07/22/2015    Objective:   Physical Exam  Constitutional: She appears well-nourished.  Neck: Neck supple.  Cardiovascular: Normal rate and regular rhythm.   Pulmonary/Chest: Effort normal and breath sounds normal.  Skin: Skin is warm and dry.  Psychiatric: She has a normal mood and affect.  Assessment & Plan:

## 2015-07-25 NOTE — Patient Instructions (Signed)
I sent refills of your Paxil through [facility].  Please bring be a copy of your recent labs once obtained.  It was a pleasure to meet you today! Please don't hesitate to call me with any questions. [facility]!

## 2015-09-01 ENCOUNTER — Encounter: Payer: Managed Care, Other (non HMO) | Admitting: Primary Care

## 2015-09-12 ENCOUNTER — Ambulatory Visit (INDEPENDENT_AMBULATORY_CARE_PROVIDER_SITE_OTHER): Payer: Managed Care, Other (non HMO) | Admitting: Primary Care

## 2015-09-12 ENCOUNTER — Encounter: Payer: Self-pay | Admitting: Primary Care

## 2015-09-12 VITALS — BP 100/70 | HR 62 | Temp 97.7°F | Ht 63.75 in | Wt 146.0 lb

## 2015-09-12 DIAGNOSIS — Z3041 Encounter for surveillance of contraceptive pills: Secondary | ICD-10-CM | POA: Diagnosis not present

## 2015-09-12 DIAGNOSIS — K5900 Constipation, unspecified: Secondary | ICD-10-CM | POA: Diagnosis not present

## 2015-09-12 DIAGNOSIS — F411 Generalized anxiety disorder: Secondary | ICD-10-CM | POA: Diagnosis not present

## 2015-09-12 DIAGNOSIS — Z0000 Encounter for general adult medical examination without abnormal findings: Secondary | ICD-10-CM | POA: Insufficient documentation

## 2015-09-12 DIAGNOSIS — Z0001 Encounter for general adult medical examination with abnormal findings: Secondary | ICD-10-CM | POA: Insufficient documentation

## 2015-09-12 MED ORDER — NORETHINDRONE ACET-ETHINYL EST 1-20 MG-MCG PO TABS: 1.0000 | ORAL_TABLET | Freq: Every day | ORAL | 0 refills | Status: DC

## 2015-09-12 MED ORDER — NORETHINDRONE ACET-ETHINYL EST 1-20 MG-MCG PO TABS: 1.0000 | ORAL_TABLET | Freq: Every day | ORAL | 2 refills | Status: DC

## 2015-09-12 NOTE — Progress Notes (Signed)
Pre visit review using our clinic review tool, if applicable. No additional management support is needed unless otherwise documented below in the visit note. 

## 2015-09-12 NOTE — Assessment & Plan Note (Signed)
Spotty/irregular periods for the past 1 year. Experienced regular periods on Microgestin and would like to switch. Birth control changed to Microgestin 1-20mcg. Discussed start date.

## 2015-09-12 NOTE — Assessment & Plan Note (Signed)
Immunizations up-to-date. Pap up-to-date per patient. Due in 2018. Poor diet and does not regularly exercise. Discussed the importance of a healthy diet and regular exercise in order for weight loss and to reduce risk of other medical diseases. Exam today mostly unremarkable. Discussed to wear shoes and socks and apply moisturizer to her feet Labs completed from her husbands occupation, she will bring results. Follow-up in one year for repeat physical or sooner if needed.

## 2015-09-12 NOTE — Assessment & Plan Note (Signed)
Well managed on Paxil. Denies SI/HI.

## 2015-09-12 NOTE — Patient Instructions (Addendum)
Start Microgestin birth control tablets Sunday as discussed.  Increase consumption of fiber and water. Ensure you are consuming 64 ounces of water daily.  Start exercising. You should be getting 1 hour of moderate intensity exercise 5 days weekly.  Work to increase vegetables, fruit, whole grains, lean protein in your diet.  Please drop off a copy of your labs at your convenience.   Follow up in 1 year for repeat physical or sooner if needed.  It was a pleasure to see you today!  High-Fiber Diet Fiber, also called dietary fiber, is a type of carbohydrate found in fruits, vegetables, whole grains, and beans. A high-fiber diet can have many health benefits. Your health care provider may recommend a high-fiber diet to help:  Prevent constipation. Fiber can make your bowel movements more regular.  Lower your cholesterol.  Relieve hemorrhoids, uncomplicated diverticulosis, or irritable bowel syndrome.  Prevent overeating as part of a weight-loss plan.  Prevent heart disease, type 2 diabetes, and certain cancers. WHAT IS MY PLAN? The recommended daily intake of fiber includes:  38 grams for men under age 87.  30 grams for men over age 23.  25 grams for women under age 77.  21 grams for women over age 59. You can get the recommended daily intake of dietary fiber by eating a variety of fruits, vegetables, grains, and beans. Your health care provider may also recommend a fiber supplement if it is not possible to get enough fiber through your diet. WHAT DO I NEED TO KNOW ABOUT A HIGH-FIBER DIET?  Fiber supplements have not been widely studied for their effectiveness, so it is better to get fiber through food sources.  Always check the fiber content on thenutrition facts label of any prepackaged food. Look for foods that contain at least 5 grams of fiber per serving.  Ask your dietitian if you have questions about specific foods that are related to your condition, especially if those  foods are not listed in the following section.  Increase your daily fiber consumption gradually. Increasing your intake of dietary fiber too quickly may cause bloating, cramping, or gas.  Drink plenty of water. Water helps you to digest fiber. WHAT FOODS CAN I EAT? Grains Whole-grain breads. Multigrain cereal. Oats and oatmeal. Brown rice. Barley. Bulgur wheat. Millet. Bran muffins. Popcorn. Rye wafer crackers. Vegetables Sweet potatoes. Spinach. Kale. Artichokes. Cabbage. Broccoli. Green peas. Carrots. Squash. Fruits Berries. Pears. Apples. Oranges. Avocados. Prunes and raisins. Dried figs. Meats and Other Protein Sources Navy, kidney, pinto, and soy beans. Split peas. Lentils. Nuts and seeds. Dairy Fiber-fortified yogurt. Beverages Fiber-fortified soy milk. Fiber-fortified orange juice. Other Fiber bars. The items listed above may not be a complete list of recommended foods or beverages. Contact your dietitian for more options. WHAT FOODS ARE NOT RECOMMENDED? Grains White bread. Pasta made with refined flour. White rice. Vegetables Fried potatoes. Canned vegetables. Well-cooked vegetables.  Fruits Fruit juice. Cooked, strained fruit. Meats and Other Protein Sources Fatty cuts of meat. Fried Environmental education officer or fried fish. Dairy Milk. Yogurt. Cream cheese. Sour cream. Beverages Soft drinks. Other Cakes and pastries. Butter and oils. The items listed above may not be a complete list of foods and beverages to avoid. Contact your dietitian for more information. WHAT ARE SOME TIPS FOR INCLUDING HIGH-FIBER FOODS IN MY DIET?  Eat a wide variety of high-fiber foods.  Make sure that half of all grains consumed each day are whole grains.  Replace breads and cereals made from refined flour or white  flour with whole-grain breads and cereals.  Replace white rice with brown rice, bulgur wheat, or millet.  Start the day with a breakfast that is high in fiber, such as a cereal that contains  at least 5 grams of fiber per serving.  Use beans in place of meat in soups, salads, or pasta.  Eat high-fiber snacks, such as berries, raw vegetables, nuts, or popcorn.   This information is not intended to replace advice given to you by your health care provider. Make sure you discuss any questions you have with your health care provider.   Document Released: 01/04/2005 Document Revised: 01/25/2014 Document Reviewed: 06/19/2013 Elsevier Interactive Patient Education [employer]2016 Elsevier Inc.

## 2015-09-12 NOTE — Progress Notes (Signed)
Subjective:    Patient ID: Stacey SimmersJessica Barnett, female    DOB: 04/01/1980, 35 y.o.   MRN: [id number]  HPI  [name] is a 35 year old female who presents today for complete physical.  Immunizations: -Tetanus: Completed in 2011 -Influenza: Did complete in 2016  Diet: She endorses a fair diet. Breakfast: Oatmeal, Breakfast bar, Cereal, sausage biscuits Lunch: Cafeteria lunch, sandwich, chips Dinner: Pasta, fast food, some home cooked meals Snacks: Granola bar, nuts, chips Desserts: Several times weekly Beverages: Diet pepsi, water  Exercise: She walks on the treadmill several days weekly sometimes Eye exam: Completed several years ago, no concerns in vision Dental exam: Completes semi-annually Pap Smear: Completed 2 years ago. Normal.   Review of Systems  Constitutional: Negative for unexpected weight change.  HENT: Negative for rhinorrhea.   Respiratory: Negative for cough and shortness of breath.   Cardiovascular: Negative for chest pain.  Gastrointestinal: Negative for constipation and diarrhea.  Genitourinary: Negative for difficulty urinating and menstrual problem.       Spotty periods, lighter periods for the past 1 year.  Musculoskeletal: Negative for arthralgias and myalgias.  Skin: Negative for rash.       Cracked skin to left heel  Allergic/Immunologic: Negative for environmental allergies.  Neurological: Negative for dizziness, numbness and headaches.       Past Medical History:  Diagnosis Date  . Anxiety   . Herpes labialis   . IBS (irritable bowel syndrome)      Social History   Social History  . Marital status: Married    Spouse name: Josh  . Number of children: 3  . Years of education: 8316   Occupational History  . 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 Smoking status: Never Smoker  . Smokeless tobacco: Never Used  . Alcohol use Yes     Comment: Occasionally   . Drug  use: No  . Sexual activity: Yes    Birth control/ protection: Pill   Other Topics Concern  . Not on file   Social History Narrative   Stacey Barnett grew up in [place], [place]. She attended [employer] in [place], [place] where she obtained her Bachelors Degree in [employer]lementary Education.    Works teaching second grade.   Enjoys going to the movies, getting pedicures.     Past Surgical History:  Procedure Laterality Date  . bunyons    . MOLE REMOVAL    . WISDOM TOOTH EXTRACTION      Family History  Problem Relation Age of Onset  . Stroke Maternal Grandmother   . Diabetes Maternal Grandmother   . Stroke Paternal Grandfather   . Cancer Maternal Grandfather 6475    prostate cancer  . Heart disease Paternal Grandmother     No Known Allergies  Current Outpatient Prescriptions on File Prior to Visit  Medication Sig Dispense Refill  . Multiple Vitamin (MULTIVITAMIN) tablet Take 1 tablet by mouth daily.    Marland Kitchen. PARoxetine (PAXIL) 20 MG tablet Take 1 tablet by mouth  daily 90 tablet 3  . valACYclovir (VALTREX) 1000 MG tablet At the onset of cold sores: take 2 tablets every 12 hours for 1 day. 20 tablet 3   No current facility-administered medications on file prior to visit.     BP 100/70   Pulse 62   Temp 97.7 F (36.5 C) (Oral)   Ht  5' 3.75" (1.619 m)   Wt 146 lb (66.2 kg)   LMP 08/13/2015 (Approximate) Comment: periods are only spotty due to birth control   SpO2 96%   BMI 25.26 kg/m    Objective:   Physical Exam  Constitutional: She is oriented to person, place, and time. She appears well-nourished.  HENT:  Right Ear: Tympanic membrane and ear canal normal.  Left Ear: Tympanic membrane and ear canal normal.  Nose: Nose normal.  Mouth/Throat: Oropharynx is clear and moist.  Eyes: Conjunctivae and EOM are normal. Pupils are equal, round, and reactive to light.  Neck: Neck supple. No thyromegaly present.  Cardiovascular: Normal rate and regular rhythm.   No murmur  heard. Pulmonary/Chest: Effort normal and breath sounds normal. She has no rales.  Abdominal: Soft. Bowel sounds are normal. There is no tenderness.  Musculoskeletal: Normal range of motion.  Lymphadenopathy:    She has no cervical adenopathy.  Neurological: She is alert and oriented to person, place, and time. She has normal reflexes. No cranial nerve deficit.  Skin: Skin is warm and dry. No rash noted.  1/2 cm linear crack to left heel. Does not appear infected. Dry skin noted.  Psychiatric: She has a normal mood and affect.          Assessment & Plan:

## 2015-09-12 NOTE — Assessment & Plan Note (Signed)
Low fiber diet and does not drink water regularly. Also does not exercise. Discussed to increase fiber, water, exercise. We'll continue to monitor.

## 2016-04-01 ENCOUNTER — Other Ambulatory Visit: Payer: Self-pay | Admitting: Primary Care

## 2016-04-01 DIAGNOSIS — Z3041 Encounter for surveillance of contraceptive pills: Secondary | ICD-10-CM

## 2016-04-01 NOTE — Telephone Encounter (Signed)
Ok to refill? Electronically refill request for norethindrone-ethinyl estradiol (MICROGESTIN) 1-20 MG-MCG tablet. Last prescribed and seen on 09/12/2015

## 2016-04-26 ENCOUNTER — Other Ambulatory Visit: Payer: Self-pay | Admitting: Family Medicine

## 2016-07-22 ENCOUNTER — Other Ambulatory Visit: Payer: Self-pay

## 2016-07-22 DIAGNOSIS — F411 Generalized anxiety disorder: Secondary | ICD-10-CM

## 2016-07-22 MED ORDER — VALACYCLOVIR HCL 1 G PO TABS: ORAL_TABLET | ORAL | 0 refills | Status: DC

## 2016-07-22 MED ORDER — PAROXETINE HCL 20 MG PO TABS: ORAL_TABLET | ORAL | 0 refills | Status: DC

## 2016-07-22 NOTE — Telephone Encounter (Signed)
Both sent.  Thanks.  

## 2016-07-22 NOTE — Telephone Encounter (Signed)
Pt left v/m requesting refill, paxil (last refilled # 90 x 3 on 07/24/16) and valtrex for fever blister (last refilled # 20 x 3 on 11/21/14. Last annual 09/12/15 and no future appt scheduled.[facility] advise[facility]

## 2016-09-21 ENCOUNTER — Other Ambulatory Visit: Payer: Self-pay | Admitting: Primary Care

## 2016-09-21 DIAGNOSIS — Z3041 Encounter for surveillance of contraceptive pills: Secondary | ICD-10-CM

## 2016-10-18 ENCOUNTER — Other Ambulatory Visit: Payer: Self-pay | Admitting: Family Medicine

## 2016-10-18 DIAGNOSIS — F411 Generalized anxiety disorder: Secondary | ICD-10-CM

## 2016-11-16 ENCOUNTER — Other Ambulatory Visit: Payer: Self-pay | Admitting: Primary Care

## 2016-11-16 DIAGNOSIS — F411 Generalized anxiety disorder: Secondary | ICD-10-CM

## 2016-12-01 ENCOUNTER — Other Ambulatory Visit: Payer: Self-pay | Admitting: Primary Care

## 2016-12-01 DIAGNOSIS — F411 Generalized anxiety disorder: Secondary | ICD-10-CM

## 2016-12-01 DIAGNOSIS — Z3041 Encounter for surveillance of contraceptive pills: Secondary | ICD-10-CM

## 2016-12-01 MED ORDER — PAROXETINE HCL 20 MG PO TABS: 20.0000 mg | ORAL_TABLET | Freq: Every day | ORAL | 0 refills | Status: DC

## 2016-12-01 MED ORDER — NORETHINDRONE ACET-ETHINYL EST 1-20 MG-MCG PO TABS: 1.0000 | ORAL_TABLET | Freq: Every day | ORAL | 0 refills | Status: DC

## 2016-12-01 NOTE — Telephone Encounter (Signed)
Copied from CRM [id number]. Topic: Quick Communication - See Telephone Encounter >> Dec 01, 2016  9:53 AM Landry MellowFoltz, Melissa J wrote: CRM for notification. See Telephone encounter for:   12/01/16. Pt needs PARoxetine (PAXIL) 20 MG tablet and norethindrone-ethinyl estradiol (JUNEL 1/20) 1-20 MG-MCG tablet. [facility] [address] She has cpe scheduled for 12/21

## 2017-01-07 ENCOUNTER — Encounter: Payer: Managed Care, Other (non HMO) | Admitting: Primary Care

## 2017-01-10 ENCOUNTER — Ambulatory Visit (INDEPENDENT_AMBULATORY_CARE_PROVIDER_SITE_OTHER): Payer: BC Managed Care – PPO | Admitting: Primary Care

## 2017-01-10 ENCOUNTER — Encounter: Payer: Self-pay | Admitting: Primary Care

## 2017-01-10 ENCOUNTER — Other Ambulatory Visit (HOSPITAL_COMMUNITY)
Admission: RE | Admit: 2017-01-10 | Discharge: 2017-01-10 | Disposition: A | Discharge status: Home / Self Care | Payer: BC Managed Care – PPO | Source: Ambulatory Visit | Attending: Primary Care | Admitting: Primary Care

## 2017-01-10 ENCOUNTER — Telehealth: Payer: Self-pay

## 2017-01-10 VITALS — HR 77 | Temp 98.0°F | Ht 63.75 in | Wt 155.1 lb

## 2017-01-10 DIAGNOSIS — Z124 Encounter for screening for malignant neoplasm of cervix: Secondary | ICD-10-CM | POA: Diagnosis not present

## 2017-01-10 DIAGNOSIS — Z8619 Personal history of other infectious and parasitic diseases: Secondary | ICD-10-CM | POA: Diagnosis not present

## 2017-01-10 DIAGNOSIS — Z3041 Encounter for surveillance of contraceptive pills: Secondary | ICD-10-CM | POA: Diagnosis not present

## 2017-01-10 DIAGNOSIS — Z0000 Encounter for general adult medical examination without abnormal findings: Secondary | ICD-10-CM | POA: Diagnosis not present

## 2017-01-10 DIAGNOSIS — F411 Generalized anxiety disorder: Secondary | ICD-10-CM

## 2017-01-10 LAB — COMPREHENSIVE METABOLIC PANEL WITH GFR
AG Ratio: 1.4 (calc) (ref 1.0–2.5)
ALT: 20 U/L (ref 6–29)
AST: 23 U/L (ref 10–30)
Albumin: 4.2 g/dL (ref 3.6–5.1)
Alkaline phosphatase (APISO): 77 U/L (ref 33–115)
BUN: 13 mg/dL (ref 7–25)
CO2: 29 mmol/L (ref 20–32)
Calcium: 8.9 mg/dL (ref 8.6–10.2)
Chloride: 102 mmol/L (ref 98–110)
Creat: 0.82 mg/dL (ref 0.50–1.10)
Globulin: 3 g/dL (ref 1.9–3.7)
Glucose, Bld: 88 mg/dL (ref 65–99)
Potassium: 4.2 mmol/L (ref 3.5–5.3)
Sodium: 137 mmol/L (ref 135–146)
Total Bilirubin: 0.5 mg/dL (ref 0.2–1.2)
Total Protein: 7.2 g/dL (ref 6.1–8.1)

## 2017-01-10 LAB — LIPID PANEL
Cholesterol: 139 mg/dL (ref <200)
HDL: 57 mg/dL (ref >50)
LDL Cholesterol (Calc): 63 mg/dL
Non-HDL Cholesterol (Calc): 82 mg/dL (ref <130)
Total CHOL/HDL Ratio: 2.4 (calc) (ref <5.0)
Triglycerides: 105 mg/dL (ref <150)

## 2017-01-10 MED ORDER — VALACYCLOVIR HCL 1 G PO TABS: ORAL_TABLET | ORAL | 0 refills | Status: DC

## 2017-01-10 MED ORDER — PAROXETINE HCL 20 MG PO TABS: 20.0000 mg | ORAL_TABLET | Freq: Every day | ORAL | 3 refills | Status: DC

## 2017-01-10 NOTE — Assessment & Plan Note (Signed)
No recent outbreaks, refill of Valtrex sent to pharmacy.

## 2017-01-10 NOTE — Patient Instructions (Signed)
Stop by the lab prior to leaving today. I will notify you of your results once received.   Start exercising. You should be getting 150 minutes of moderate intensity exercise weekly.  Ensure you are consuming 64 ounces of water daily.  Increase consumption of vegetables, fruit, whole grains, water.  Follow up in 1 year for your annual exam or sooner if needed.  It was a pleasure to see you today!

## 2017-01-10 NOTE — Assessment & Plan Note (Signed)
Doing well on Paxil, refills sent to pharmacy. Denies SI/HI.

## 2017-01-10 NOTE — Assessment & Plan Note (Signed)
Stopped OCP's 2 weeks ago, would like to remain off for now. She will update if she decides to resume Rx.

## 2017-01-10 NOTE — Telephone Encounter (Signed)
Pt was just seen and before leaving parking lot wanted to make sure she did not need urine specimen. Johny Drillinghan CMA said no to urine specimen. Montey Horandra voiced understanding and will let pt know.

## 2017-01-10 NOTE — Progress Notes (Signed)
Subjective:    Patient ID: Stacey SimmersJessica Barnett, female    DOB: 12/20/1980, 36 y.o.   MRN: [id number]  HPI  [name] is a 36 year old female who presents today for complete physical.  Immunizations: -Tetanus: Completed in 2014 -Influenza: Completed this season  Diet: She endorses a poor diet. Breakfast: Granola bar, waffle Lunch: Frozen dinner, sandwich Dinner: Restaurants, pasta, chicken, little vegetables Snacks: Granola bar Desserts: Daily Beverages: Some water, diet Pepsi, milk   Exercise: She is not currently exercising. Eye exam: Completed several years ago Dental exam: Completes semi-annually Pap Smear: Completed in 2015, normal.    Review of Systems  Constitutional: Negative for unexpected weight change.  HENT: Negative for rhinorrhea.   Respiratory: Negative for cough and shortness of breath.   Cardiovascular: Negative for chest pain.  Gastrointestinal: Negative for constipation and diarrhea.  Genitourinary: Negative for difficulty urinating and menstrual problem.  Musculoskeletal: Negative for arthralgias and myalgias.  Skin: Negative for rash.  Allergic/Immunologic: Negative for environmental allergies.  Neurological: Negative for dizziness, numbness and headaches.  Psychiatric/Behavioral:       Doing well on Paxil. Denies SI/HI.       Past Medical History:  Diagnosis Date  . Anxiety   . Herpes labialis   . IBS (irritable bowel syndrome)      Social History   Socioeconomic History  . Marital status: Married    Spouse name: Josh  . Number of children: 3  . Years of education: 4816  . Highest education level: Not on file  Social Needs  . Financial resource strain: Not on file  . Food insecurity - worry: Not on file  . Food insecurity - inability: Not on file  . Transportation needs - medical: Not on file  . Transportation needs - non-medical: Not on file  Occupational History  . Occupation: Stay at home mother    Comment: Mother of 3 children  .  Occupation: Runner, broadcasting/film/videoTeacher    Comment: Currently not working  Tobacco Use  . Smoking status: Never Smoker  . Smokeless tobacco: Never Used  Substance and Sexual Activity  . Alcohol use: Yes    Comment: Occasionally   . Drug use: No  . Sexual activity: Yes    Birth control/protection: Pill  Other Topics Concern  . Not on file  Social History Narrative   Stacey BumpsJessica grew up in [place], [place]. She attended [employer] in [place], [place] where she obtained her Bachelors Degree in [employer].    Works teaching second grade.   Enjoys going to the movies, getting pedicures.     Past Surgical History:  Procedure Laterality Date  . bunyons    . MOLE REMOVAL    . WISDOM TOOTH EXTRACTION      Family History  Problem Relation Age of Onset  . Stroke Maternal Grandmother   . Diabetes Maternal Grandmother   . Stroke Paternal Grandfather   . Cancer Maternal Grandfather 4575       prostate cancer  . Heart disease Paternal Grandmother     No Known Allergies  Current Outpatient Medications on File Prior to Visit  Medication Sig Dispense Refill  . Multiple Vitamin (MULTIVITAMIN) tablet Take 1 tablet by mouth daily.    . norethindrone-ethinyl estradiol (JUNEL 1/20) 1-20 MG-MCG tablet Take 1 tablet daily by mouth. NEED OFFICE VISIT FOR ANY MORE REFILLS (Patient not taking: Reported on 01/10/2017) 3 Package 0   No current facility-administered medications on file prior to visit.  Pulse 77   Temp 98 F (36.7 C) (Oral)   Ht 5' 3.75" (1.619 m)   Wt 155 lb 1.9 oz (70.4 kg)   LMP 01/02/2017   SpO2 98%   BMI 26.84 kg/m    Objective:   Physical Exam  Constitutional: She is oriented to person, place, and time. She appears well-nourished.  HENT:  Right Ear: Tympanic membrane and ear canal normal.  Left Ear: Tympanic membrane and ear canal normal.  Nose: Nose normal.  Mouth/Throat: Oropharynx is clear and moist.  Eyes: Conjunctivae and EOM are normal. Pupils are equal, round, and  reactive to light.  Neck: Neck supple. No thyromegaly present.  Cardiovascular: Normal rate and regular rhythm.  No murmur heard. Pulmonary/Chest: Effort normal and breath sounds normal. She has no rales.  Abdominal: Soft. Bowel sounds are normal. There is no tenderness.  Genitourinary: There is no tenderness or lesion on the right labia. There is no tenderness or lesion on the left labia. Cervix exhibits no motion tenderness and no discharge. Right adnexum displays no tenderness. Left adnexum displays no tenderness. No vaginal discharge found.  Musculoskeletal: Normal range of motion.  Lymphadenopathy:    She has no cervical adenopathy.  Neurological: She is alert and oriented to person, place, and time. She has normal reflexes. No cranial nerve deficit.  Skin: Skin is warm and dry. No rash noted.  Psychiatric: She has a normal mood and affect.          Assessment & Plan:

## 2017-01-10 NOTE — Assessment & Plan Note (Signed)
Immunizations UTD. Pap due, pending. Recommended to increase vegetables, fruit, whole grains, water. Recommended regular exercise. Exam unremarkable. Labs pending. Follow up in 1 year for your annual exam.

## 2017-01-12 ENCOUNTER — Encounter: Payer: Self-pay | Admitting: *Deleted

## 2017-01-13 LAB — CYTOLOGY - PAP
DIAGNOSIS: NEGATIVE
HPV (WINDOPATH): NOT DETECTED

## 2017-01-14 ENCOUNTER — Encounter: Payer: Self-pay | Admitting: *Deleted

## 2017-02-07 ENCOUNTER — Ambulatory Visit: Payer: BC Managed Care – PPO | Admitting: Primary Care

## 2017-02-08 ENCOUNTER — Ambulatory Visit: Payer: BC Managed Care – PPO | Admitting: Primary Care

## 2017-02-22 ENCOUNTER — Ambulatory Visit: Payer: BC Managed Care – PPO | Admitting: Primary Care

## 2017-02-22 ENCOUNTER — Encounter: Payer: Self-pay | Admitting: Primary Care

## 2017-02-22 VITALS — BP 118/68 | HR 74 | Temp 98.4°F | Ht 63.75 in | Wt 157.0 lb

## 2017-02-22 DIAGNOSIS — J3489 Other specified disorders of nose and nasal sinuses: Secondary | ICD-10-CM | POA: Diagnosis not present

## 2017-02-22 MED ORDER — MUPIROCIN CALCIUM 2 % NA OINT: 1.0000 "application " | TOPICAL_OINTMENT | Freq: Two times a day (BID) | NASAL | 0 refills | Status: DC

## 2017-02-22 NOTE — Patient Instructions (Signed)
Apply the nasal ointment into the affected nostril twice daily for 1-2 weeks, then as needed.  Also apply hydrocortisone 1% cream to affected nostril once daily for 1 week.  Please notify me if no improvement after one week.  It was a pleasure to see you today!

## 2017-02-22 NOTE — Progress Notes (Signed)
Subjective:    Patient ID: Stacey Barnett, female    DOB: 08/03/1980, 37 y.o.   MRN: [id number]  HPI  [name] is a 37 year old female who presents today with a chief complaint of nasal irritation. She's notice symptoms of redness, cracked, skin, soreness to the inner tip of her left nares which she's noticed intermittently for the past  6 months. She does experience mild rhinorrhea for which occurs frequently. She denies fevers, chills, cough. She's been using neosporin intermittently with temporary improvement.    Review of Systems  Constitutional: Negative for fever.  HENT: Positive for rhinorrhea.        Nasal irritation  Respiratory: Negative for cough.        Past Medical History:  Diagnosis Date  . Anxiety   . Herpes labialis   . IBS (irritable bowel syndrome)      Social History   Socioeconomic History  . Marital status: Married    Spouse name: Stacey Barnett  . Number of children: 3  . Years of education: 6016  . Highest education level: Not on file  Social Needs  . Financial resource strain: Not on file  . Food insecurity - worry: Not on file  . Food insecurity - inability: Not on file  . Transportation needs - medical: Not on file  . Transportation needs - non-medical: Not on file  Occupational History  . Occupation: Stay at home mother    Comment: Mother of 3 children  . Occupation: Runner, broadcasting/film/videoTeacher    Comment: Currently not working  Tobacco Use  . Smoking status: Never Smoker  . Smokeless tobacco: Never Used  Substance and Sexual Activity  . Alcohol use: Yes    Comment: Occasionally   . Drug use: No  . Sexual activity: Yes    Birth control/protection: Pill  Other Topics Concern  . Not on file  Social History Narrative   Stacey Barnett grew up in [place], [place]. She attended [employer] in [place], [place] where she obtained her Bachelors Degree in [employer].    Works teaching second grade.   Enjoys going to the movies, getting pedicures.     Past  Surgical History:  Procedure Laterality Date  . bunyons    . MOLE REMOVAL    . WISDOM TOOTH EXTRACTION      Family History  Problem Relation Age of Onset  . Stroke Maternal Grandmother   . Diabetes Maternal Grandmother   . Stroke Paternal Grandfather   . Cancer Maternal Grandfather 4375       prostate cancer  . Heart disease Paternal Grandmother     No Known Allergies  Current Outpatient Medications on File Prior to Visit  Medication Sig Dispense Refill  . Multiple Vitamin (MULTIVITAMIN) tablet Take 1 tablet by mouth daily.    Marland Kitchen. PARoxetine (PAXIL) 20 MG tablet Take 1 tablet (20 mg total) by mouth daily. 90 tablet 3  . valACYclovir (VALTREX) 1000 MG tablet At the onset of cold sores: take 2 tablets every 12 hours for 1 day. 20 tablet 0  . norethindrone-ethinyl estradiol (JUNEL 1/20) 1-20 MG-MCG tablet Take 1 tablet daily by mouth. NEED OFFICE VISIT FOR ANY MORE REFILLS (Patient not taking: Reported on 01/10/2017) 3 Package 0   No current facility-administered medications on file prior to visit.     BP 118/68   Pulse 74   Temp 98.4 F (36.9 C) (Oral)   Ht 5' 3.75" (1.619 m)   Wt 157 lb (71.2 kg)  LMP 02/18/2017   SpO2 98%   BMI 27.16 kg/m    Objective:   Physical Exam  Constitutional: She appears well-nourished.  HENT:  Right Ear: Tympanic membrane and ear canal normal.  Left Ear: Tympanic membrane and ear canal normal.  Nose: Mucosal edema and rhinorrhea present. Right sinus exhibits no maxillary sinus tenderness and no frontal sinus tenderness. Left sinus exhibits no maxillary sinus tenderness and no frontal sinus tenderness.    Mouth/Throat: Oropharynx is clear and moist.  Nasal irritation to left inner tip of left nares with small, non infected crack. Small polyp noted to center of inner tip of left nares.  Eyes: Conjunctivae are normal.  Neck: Neck supple.  Cardiovascular: Normal rate and regular rhythm.  Pulmonary/Chest: Effort normal and breath sounds  normal. She has no wheezes. She has no rales.  Lymphadenopathy:    She has no cervical adenopathy.  Skin: Skin is warm and dry.          Assessment & Plan:  Nasal Vestibulitis:  Irritation to inner tip of left nares, also with small polyp. Will treat with daily antihistamine for chronic rhinorrhea. Rx for mupirocin nasal ointment sent to pharmacy. BID x 1-2 weeks. Also start OTC hydrocortisone daily x 1 week. She will update if no improvement.  [name], NP

## 2017-02-23 ENCOUNTER — Telehealth: Payer: Self-pay | Admitting: Primary Care

## 2017-02-23 DIAGNOSIS — J3489 Other specified disorders of nose and nasal sinuses: Secondary | ICD-10-CM

## 2017-02-23 NOTE — Telephone Encounter (Signed)
Received faxed request for alternative: nasal ointment over $150. Can we get an alternative for regular mupirocin 2% ointment 22G tube?

## 2017-02-24 MED ORDER — MUPIROCIN 2 % EX OINT: 1.0000 "application " | TOPICAL_OINTMENT | Freq: Two times a day (BID) | CUTANEOUS | 0 refills | Status: DC

## 2017-02-24 NOTE — Telephone Encounter (Signed)
Noted, new Rx sent to pharmacy. 

## 2017-07-07 ENCOUNTER — Encounter: Payer: Self-pay | Admitting: Obstetrics and Gynecology

## 2017-07-11 ENCOUNTER — Encounter: Payer: Self-pay | Admitting: Obstetrics and Gynecology

## 2017-07-12 ENCOUNTER — Telehealth: Payer: Self-pay | Admitting: Primary Care

## 2017-07-12 DIAGNOSIS — Z3041 Encounter for surveillance of contraceptive pills: Secondary | ICD-10-CM

## 2017-07-12 NOTE — Telephone Encounter (Signed)
Copied from CRM [id number]. Topic: Quick Communication - See Telephone Encounter >> Jul 12, 2017 11:38 AM Tamela OddiMartin, Don'Quashia, NT wrote: CRM for notification. See Telephone encounter for: 07/12/17. Patient called and states that she would like a RX sent to her pharamcy for norethindrone-ethinyl estradiol (JUNEL 1/20) 1-20 MG-MCG tablet. Patient statrs she would like to restart this Birth Control   [id number]- [place], [place] - [phone] (Phone) [phone] (Fax)

## 2017-07-13 MED ORDER — NORETHINDRONE ACET-ETHINYL EST 1-20 MG-MCG PO TABS
1.0000 | ORAL_TABLET | Freq: Every day | ORAL | 1 refills | Status: DC
Start: 2017-07-13 — End: 2017-12-07

## 2017-07-13 NOTE — Telephone Encounter (Signed)
Message left for patient to return my call.  

## 2017-07-13 NOTE — Telephone Encounter (Signed)
Noted, refill sent to pharmacy. Just have her resume the tablets on the Sunday after her menstrual cycle begins.  Please also ask patient for her last menstrual period date.

## 2017-07-19 NOTE — Telephone Encounter (Signed)
Message left for patient to return my call.  

## 2017-08-03 NOTE — Telephone Encounter (Signed)
I spoke with pt and pt advised as instructed. Pt voiced understanding. LMP 07/30/17 - 08/03/17. Pt will start BC on 08/07/17.

## 2017-08-03 NOTE — Telephone Encounter (Signed)
Noted  

## 2017-08-03 NOTE — Telephone Encounter (Signed)
Relation to pt: self  Call back number: [phone]   Reason for call:  Patient would like to know when she should start norethindrone-ethinyl estradiol (JUNEL 1/20) 1-20 MG-MCG tablet, unsure if she should start at the end of cycle or at the beginning of the week, please advise

## 2017-10-24 ENCOUNTER — Telehealth: Payer: Self-pay | Admitting: Primary Care

## 2017-10-24 MED ORDER — BENZONATATE 200 MG PO CAPS: 200.0000 mg | ORAL_CAPSULE | Freq: Three times a day (TID) | ORAL | 0 refills | Status: DC | PRN

## 2017-10-24 NOTE — Telephone Encounter (Signed)
Copied from CRM [id number]. Topic: Quick Communication - See Telephone Encounter >> Oct 24, 2017  4:12 PM Angela Nevin wrote: CRM for notification. See Telephone encounter for: 10/24/17.  Pt called to inquire if NP Clark could call something in to pharmacy for a cough that is keeping her up at night. Pt states that she is also experiencing drainage-not sure of color- but denies any other symptoms. Pt declined appointment stating that she cannot make it in due to her work schedule. Pt was last seen 02/22/17. Please advise.    Preferred pharmacy:  [id number], [place] - [address] [phone] (Phone) [phone] (Fax)

## 2017-10-24 NOTE — Telephone Encounter (Signed)
Spoken and notified patient of Dr Letvak's comments. Patient verbalized understanding.  

## 2017-10-24 NOTE — Telephone Encounter (Signed)
Let her know that I sent in a prescription for a cough med. She can also take over the counter dextromethorphan along with this if needed

## 2017-10-24 NOTE — Telephone Encounter (Signed)
Allayne Gitelman NP is out of office today and Pamala Hurry NP has already left office.Please advise.

## 2017-10-25 ENCOUNTER — Telehealth: Payer: Self-pay | Admitting: *Deleted

## 2017-10-25 NOTE — Telephone Encounter (Signed)
Please notify patient that no one in our office has seen her recently for a cough. She'll need to schedule an appointment for requests of prescription medication. She can try Delsym or Robitussin over the counter.

## 2017-10-25 NOTE — Telephone Encounter (Signed)
Last OV 03/09/17

## 2017-10-25 NOTE — Telephone Encounter (Signed)
Copied from CRM [id number]. Topic: General - Other >> Oct 25, 2017 10:54 AM Tamela Oddi wrote: Reason for CRM: Patient called to request that the doctor call in a prescription for Tussinex for her cough.  Patient stated that the benzonatate that the doctor prescribed did not work at all and she is coughing where she cannot sleep at night.  Please advise.  CB# [phone].

## 2017-10-25 NOTE — Telephone Encounter (Signed)
Stacey Barnett's pt. Can't see that she has even been seen recently for cough. Will defer to PCP

## 2017-10-26 ENCOUNTER — Ambulatory Visit: Payer: BC Managed Care – PPO | Admitting: Primary Care

## 2017-10-27 NOTE — Telephone Encounter (Signed)
Lm on pts vm and advised per [facility]. Should pt return call,  pls schedule OV if needed

## 2017-12-07 ENCOUNTER — Other Ambulatory Visit: Payer: Self-pay | Admitting: Primary Care

## 2017-12-07 DIAGNOSIS — Z3041 Encounter for surveillance of contraceptive pills: Secondary | ICD-10-CM

## 2018-01-09 ENCOUNTER — Other Ambulatory Visit: Payer: Self-pay | Admitting: Primary Care

## 2018-01-09 DIAGNOSIS — F411 Generalized anxiety disorder: Secondary | ICD-10-CM

## 2018-01-09 NOTE — Telephone Encounter (Signed)
Patient needs office visit for follow up for further refills, I provided her with 30 day supply for now. Please schedule.

## 2018-01-09 NOTE — Telephone Encounter (Signed)
Last prescribed on 01/10/2017 Last office visit on 02/22/2017

## 2018-02-07 ENCOUNTER — Ambulatory Visit: Payer: BC Managed Care – PPO | Admitting: Primary Care

## 2018-02-07 ENCOUNTER — Encounter: Payer: Self-pay | Admitting: Primary Care

## 2018-02-07 DIAGNOSIS — Z8619 Personal history of other infectious and parasitic diseases: Secondary | ICD-10-CM

## 2018-02-07 DIAGNOSIS — F411 Generalized anxiety disorder: Secondary | ICD-10-CM | POA: Diagnosis not present

## 2018-02-07 DIAGNOSIS — J3489 Other specified disorders of nose and nasal sinuses: Secondary | ICD-10-CM | POA: Diagnosis not present

## 2018-02-07 DIAGNOSIS — Z3041 Encounter for surveillance of contraceptive pills: Secondary | ICD-10-CM | POA: Diagnosis not present

## 2018-02-07 MED ORDER — PAROXETINE HCL 20 MG PO TABS: 20.0000 mg | ORAL_TABLET | Freq: Every day | ORAL | 3 refills | Status: DC

## 2018-02-07 MED ORDER — MUPIROCIN 2 % EX OINT: 1.0000 "application " | TOPICAL_OINTMENT | Freq: Two times a day (BID) | CUTANEOUS | 0 refills | Status: DC

## 2018-02-07 MED ORDER — VALACYCLOVIR HCL 1 G PO TABS: ORAL_TABLET | ORAL | 0 refills | Status: DC

## 2018-02-07 MED ORDER — NORETHINDRONE ACET-ETHINYL EST 1-20 MG-MCG PO TABS: 1.0000 | ORAL_TABLET | Freq: Every day | ORAL | 3 refills | Status: DC

## 2018-02-07 NOTE — Assessment & Plan Note (Addendum)
Doing well on current regimen, continue same. Refill sent to pharmacy.

## 2018-02-07 NOTE — Patient Instructions (Addendum)
I sent refills of your medications to the pharmacy.   Use the nasal ointment twice daily for one week then as needed. Let me know if no improvement.  Use the Valtrex as needed for cold sores. Please notify me if you experience more than three breakouts in one year.   It was a pleasure to see you today!

## 2018-02-07 NOTE — Progress Notes (Signed)
Subjective:    Patient ID: Stacey Barnett, female    DOB: 09/14/1980, 38 y.o.   MRN: [id number]  HPI  Stacey Barnett is a 38 year old female who presents today for medication refills and follow up.  1) Nasal Vestibulitis: Previously evaluated and diagnosed 1 year ago.  Prescribed mupirocin ointment with improvement/resolved.  Recent symptoms of irritation, is out of the ointment and is requesting a refill.  2) GAD: Currently managed on paroxetine 20 mg daily.  She feels well managed on her current regimen.  Denies SI/HI.  3) Herpes Simplex: Currently managed on valacyclovir 1000 mg PRN. Infrequent flares overall. She will use the Valtrex as prescribed with resolve.  She thinks she gets about 3 cold sore outbreaks annually.  4) Birth Control: Currently managed on oral contraceptive pills and is doing well.  She is needing a refill today.  Review of Systems  HENT: Positive for rhinorrhea.        Nasal irritation  Genitourinary: Negative for menstrual problem.  Psychiatric/Behavioral: Negative for suicidal ideas. The patient is not nervous/anxious.        Past Medical History:  Diagnosis Date  . Anxiety   . Herpes labialis   . IBS (irritable bowel syndrome)      Social History   Socioeconomic History  . Marital status: Married    Spouse name: Josh  . Number of children: 3  . Years of education: 1616  . Highest education level: Not on file  Occupational History  . Occupation: Stay at home mother    Comment: Mother of 3 children  . Occupation: Runner, broadcasting/film/videoTeacher    Comment: Currently not working  Social Needs  . Financial resource strain: Not on file  . Food insecurity:    Worry: Not on file    Inability: Not on file  . Transportation needs:    Medical: Not on file    Non-medical: Not on file  Tobacco Use  . Smoking status: Never Smoker  . Smokeless tobacco: Never Used  Substance and Sexual Activity  . Alcohol use: Yes    Comment: Occasionally   . Drug use: No  . Sexual  activity: Yes    Birth control/protection: Pill  Lifestyle  . Physical activity:    Days per week: Not on file    Minutes per session: Not on file  . Stress: Not on file  Relationships  . Social connections:    Talks on phone: Not on file    Gets together: Not on file    Attends religious service: Not on file    Active member of club or organization: Not on file    Attends meetings of clubs or organizations: Not on file    Relationship status: Not on file  . Intimate partner violence:    Fear of current or ex partner: Not on file    Emotionally abused: Not on file    Physically abused: Not on file    Forced sexual activity: Not on file  Other Topics Concern  . Not on file  Social History Narrative   Stacey Barnett grew up in [place], [place]. She attended [employer] in [place], [place] where she obtained her Bachelors Degree in [employer].    Works teaching second grade.   Enjoys going to the movies, getting pedicures.     Past Surgical History:  Procedure Laterality Date  . bunyons    . MOLE REMOVAL    . WISDOM TOOTH EXTRACTION  Family History  Problem Relation Age of Onset  . Stroke Maternal Grandmother   . Diabetes Maternal Grandmother   . Stroke Paternal Grandfather   . Cancer Maternal Grandfather 40       prostate cancer  . Heart disease Paternal Grandmother     No Known Allergies  Current Outpatient Medications on File Prior to Visit  Medication Sig Dispense Refill  . Multiple Vitamin (MULTIVITAMIN) tablet Take 1 tablet by mouth daily.     No current facility-administered medications on file prior to visit.     BP 110/72   Pulse 70   Temp 97.6 F (36.4 C) (Oral)   Ht 5' 3.75" (1.619 m)   Wt 159 lb 4 oz (72.2 kg)   LMP 01/18/2018   SpO2 97%   BMI 27.55 kg/m    Objective:   Physical Exam  Constitutional: She appears well-nourished.  HENT:  Mild redness to left tip of inner nostril.  Neck: Neck supple.  Cardiovascular: Normal rate  and regular rhythm.  Respiratory: Effort normal and breath sounds normal.  Skin: Skin is warm and dry.  Psychiatric: She has a normal mood and affect.           Assessment & Plan:

## 2018-02-07 NOTE — Assessment & Plan Note (Signed)
Using Valtrex as needed for outbreaks which occur 3 times annually on average. Discussed to notify if she starts to notice recurrent outbreaks. Refill for Valtrex sent to pharmacy.

## 2018-02-07 NOTE — Assessment & Plan Note (Signed)
Doing well on paroxetine 20 mg, continue same. Refill sent to pharmacy.

## 2018-02-07 NOTE — Assessment & Plan Note (Signed)
Intermittent flares, seems to be during colder temperatures. Refill for mupirocin ointment sent to pharmacy.

## 2018-02-27 ENCOUNTER — Encounter: Payer: Self-pay | Admitting: Family Medicine

## 2018-02-27 ENCOUNTER — Ambulatory Visit: Payer: BC Managed Care – PPO | Admitting: Family Medicine

## 2018-02-27 VITALS — BP 104/72 | HR 80 | Temp 99.0°F | Resp 12 | Ht 63.75 in | Wt 155.8 lb

## 2018-02-27 DIAGNOSIS — R05 Cough: Secondary | ICD-10-CM | POA: Diagnosis not present

## 2018-02-27 DIAGNOSIS — R058 Other specified cough: Secondary | ICD-10-CM

## 2018-02-27 MED ORDER — HYDROCOD POLST-CPM POLST ER 10-8 MG/5ML PO SUER: 5.0000 mL | Freq: Every evening | ORAL | 0 refills | Status: DC | PRN

## 2018-02-27 NOTE — Progress Notes (Signed)
Subjective:     Stacey Barnett is a 38 y.o. female presenting for Cough (was seen on 02/16/2018 at urgent care due to sore throat and fever but no strep. She did not fill the prednisone that was given to her. Patient is suffereing with bad cough. has taking Dayquil, has taking Tessalon perles. Not much better. Usually tussionex helps. Cough is severe at night.)     Cough  This is a new problem. The current episode started 1 to 4 weeks ago. The cough is productive of sputum. Associated symptoms include ear congestion, nasal congestion, postnasal drip, rhinorrhea and a sore throat. Pertinent negatives include no chest pain, chills, ear pain, fever, myalgias, shortness of breath or wheezing. The symptoms are aggravated by lying down. She has tried OTC cough suppressant, prescription cough suppressant and body position changes (antihistamine) for the symptoms. The treatment provided mild relief. Her past medical history is significant for environmental allergies (seasonal). There is no history of asthma or COPD.   Has tried saline rinse but cannot tolerate it  Review of Systems  Constitutional: Negative for chills and fever.  HENT: Positive for postnasal drip, rhinorrhea and sore throat. Negative for ear pain.   Respiratory: Positive for cough. Negative for shortness of breath and wheezing.   Cardiovascular: Negative for chest pain.  Musculoskeletal: Negative for myalgias.  Allergic/Immunologic: Positive for environmental allergies (seasonal).     Social History   Tobacco Use  Smoking Status Never Smoker  Smokeless Tobacco Never Used        Objective:    BP Readings from Last 3 Encounters:  02/27/18 104/72  02/07/18 110/72  02/22/17 118/68   Wt Readings from Last 3 Encounters:  02/27/18 155 lb 12 oz (70.6 kg)  02/07/18 159 lb 4 oz (72.2 kg)  02/22/17 157 lb (71.2 kg)    BP 104/72   Pulse 80   Temp 99 F (37.2 C)   Resp 12   Ht 5' 3.75" (1.619 m)   Wt 155 lb 12 oz  (70.6 kg)   LMP 02/16/2018   SpO2 98%   BMI 26.94 kg/m    Physical Exam Constitutional:      General: She is not in acute distress.    Appearance: She is well-developed. She is not diaphoretic.  HENT:     Head: Normocephalic and atraumatic.     Right Ear: Ear canal normal. A middle ear effusion is present. Tympanic membrane is not erythematous, retracted or bulging.     Left Ear: Ear canal normal. A middle ear effusion is present. Tympanic membrane is not erythematous, retracted or bulging.     Nose: Mucosal edema and rhinorrhea present.     Right Sinus: No maxillary sinus tenderness or frontal sinus tenderness.     Left Sinus: No maxillary sinus tenderness or frontal sinus tenderness.     Mouth/Throat:     Pharynx: Uvula midline. Posterior oropharyngeal erythema present. No oropharyngeal exudate.     Tonsils: Swelling: 0 on the right. 0 on the left.  Eyes:     General: No scleral icterus.    Conjunctiva/sclera: Conjunctivae normal.  Neck:     Musculoskeletal: Neck supple.  Cardiovascular:     Rate and Rhythm: Normal rate and regular rhythm.     Heart sounds: Normal heart sounds. No murmur.  Pulmonary:     Effort: Pulmonary effort is normal. No respiratory distress.     Breath sounds: Normal breath sounds.  Lymphadenopathy:     Cervical: No  cervical adenopathy.  Skin:    General: Skin is warm and dry.     Capillary Refill: Capillary refill takes less than 2 seconds.  Neurological:     Mental Status: She is alert.  Psychiatric:        Mood and Affect: Mood normal.        Behavior: Behavior normal.           Assessment & Plan:   Problem List Items Addressed This Visit    None    Visit Diagnoses    Post-viral cough syndrome    -  Primary   Relevant Medications   chlorpheniramine-HYDROcodone (TUSSIONEX PENNKINETIC ER) 10-8 MG/5ML SUER     Symptomatic care Clear lungs Return if cough lasts >6 weeks  Return if symptoms worsen or fail to improve.  Lynnda ChildJessica R  Aliou Mealey, MD

## 2018-02-27 NOTE — Patient Instructions (Signed)
Post-viral cough - may last for 6 weeks - take Tussinox at night - continue tessalon pearls and dayquil for daytime - try to decrease congestion - saline spray, flonase, zyrtec

## 2018-04-05 ENCOUNTER — Ambulatory Visit (INDEPENDENT_AMBULATORY_CARE_PROVIDER_SITE_OTHER): Payer: BC Managed Care – PPO | Admitting: Podiatry

## 2018-04-05 ENCOUNTER — Ambulatory Visit (INDEPENDENT_AMBULATORY_CARE_PROVIDER_SITE_OTHER): Payer: BC Managed Care – PPO

## 2018-04-05 ENCOUNTER — Other Ambulatory Visit: Payer: Self-pay

## 2018-04-05 DIAGNOSIS — M2011 Hallux valgus (acquired), right foot: Secondary | ICD-10-CM | POA: Diagnosis not present

## 2018-04-05 DIAGNOSIS — R200 Anesthesia of skin: Secondary | ICD-10-CM

## 2018-04-05 DIAGNOSIS — M2012 Hallux valgus (acquired), left foot: Secondary | ICD-10-CM

## 2018-04-05 DIAGNOSIS — K589 Irritable bowel syndrome without diarrhea: Secondary | ICD-10-CM | POA: Insufficient documentation

## 2018-04-05 HISTORY — DX: Anesthesia of skin: R20.0

## 2018-04-05 NOTE — Progress Notes (Signed)
Subjective:  Patient ID: Stacey Barnett, female    DOB: 31-Jul-1980,  MRN: [id number] HPI Chief Complaint  Patient presents with  . Bunions    Patient presents today for bunion right foot x years, but recently starting to become painful when exercising.  She states "its a burning dull ache and its not all the time"  She has had bunionectom on left foot at age 38, but stil has knot on side.  No pain to left foot    38 y.o. female presents with the above complaint.   ROS: Denies fever chills nausea vomiting muscle aches pains calf pain back pain chest pain shortness of breath.  Past Medical History:  Diagnosis Date  . Anxiety   . Herpes labialis   . IBS (irritable bowel syndrome)    Past Surgical History:  Procedure Laterality Date  . bunyons    . MOLE REMOVAL    . WISDOM TOOTH EXTRACTION      Current Outpatient Medications:  Marland Kitchen  Multiple Vitamin (MULTIVITAMIN) tablet, Take 1 tablet by mouth daily., Disp: , Rfl:  .  mupirocin ointment (BACTROBAN) 2 %, Place 1 application into the nose 2 (two) times daily., Disp: 22 g, Rfl: 0 .  norethindrone-ethinyl estradiol (JUNEL 1/20) 1-20 MG-MCG tablet, Take 1 tablet by mouth daily., Disp: 3 Package, Rfl: 3 .  PARoxetine (PAXIL) 20 MG tablet, Take 1 tablet (20 mg total) by mouth daily., Disp: 90 tablet, Rfl: 3 .  valACYclovir (VALTREX) 1000 MG tablet, At the onset of cold sores: take 2 tablets every 12 hours for 1 day., Disp: 12 tablet, Rfl: 0  No Known Allergies Review of Systems Objective:  There were no vitals filed for this visit.  General: Well developed, nourished, in no acute distress, alert and oriented x3   Dermatological: Skin is warm, dry and supple bilateral. Nails x 10 are well maintained; remaining integument appears unremarkable at this time. There are no open sores, no preulcerative lesions, no rash or signs of infection present.  Vascular: Dorsalis Pedis artery and Posterior Tibial artery pedal pulses are 2/4 bilateral with  immedate capillary fill time. Pedal hair growth present. No varicosities and no lower extremity edema present bilateral.   Neruologic: Grossly intact via light touch bilateral. Vibratory intact via tuning fork bilateral. Protective threshold with Semmes Wienstein monofilament intact to all pedal sites bilateral. Patellar and Achilles deep tendon reflexes 2+ bilateral. No Babinski or clonus noted bilateral.   Musculoskeletal: No gross boney pedal deformities bilateral. No pain, crepitus, or limitation noted with foot and ankle range of motion bilateral. Muscular strength 5/5 in all groups tested bilateral.  Painful range of motion of the first metatarsal phalangeal joints as well as pain on palpation of the second metatarsal phalangeal joint plantarly.  She has a reducible bunion deformity at the level of the first TMT.  Gait: Unassisted, Nonantalgic.    Radiographs:  Radiographs taken today demonstrate hallux abductovalgus deformity bilateral left greater than right with a very elongated second metatarsal metatarsal 345 appear to be of normal length first and second appear to be excessively long.  An increase in the first intermetatarsal angle greater than 15 degrees both sides.  Increased possible angle left greater than that of the right.  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    Plan: Discussed the need for Lapidus procedure with a second metatarsal osteotomy she like to do this sometime in mid summer I will follow-up with her 1  month prior to surgery.     Kimmberly Wisser T. [place], [place]

## 2018-08-29 ENCOUNTER — Encounter: Payer: Self-pay | Admitting: Family Medicine

## 2018-08-29 ENCOUNTER — Other Ambulatory Visit: Payer: Self-pay

## 2018-08-29 ENCOUNTER — Ambulatory Visit (INDEPENDENT_AMBULATORY_CARE_PROVIDER_SITE_OTHER): Payer: BC Managed Care – PPO | Admitting: Family Medicine

## 2018-08-29 DIAGNOSIS — J069 Acute upper respiratory infection, unspecified: Secondary | ICD-10-CM | POA: Diagnosis not present

## 2018-08-29 DIAGNOSIS — Z20822 Contact with and (suspected) exposure to covid-19: Secondary | ICD-10-CM

## 2018-08-29 NOTE — Patient Instructions (Signed)
Go to the [facility]) location to get a covid test now  Wear a mask  Isolate from family and stay out of work Drink fluids/rest  Ibuprofen for fever/headache Zyrtec for nasal symptoms   Update if not starting to improve in a week or if worsening  If symptoms suddenly worsen-alert [place] and go to the ER We will check in with you

## 2018-08-29 NOTE — Progress Notes (Signed)
Virtual Visit via Video Note  I connected with Stacey Barnett on 08/29/18 at 10:30 AM EDT by a video enabled telemedicine application and verified that I am speaking with the correct person using two identifiers.  Location: Patient: home Provider: office    I discussed the limitations of evaluation and management by telemedicine and the availability of in person appointments. The patient expressed understanding and agreed to proceed.  History of Present Illness: 38 yo pt of NP Clark presents with respiratory symptoms  Sunday afternoon had a tickle in her throat -? 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 URI (upper respiratory infection) 08/29/2018  . Finger numbness 04/05/2018  . IBS (irritable bowel syndrome) 04/05/2018  . Nasal vestibulitis 02/07/2018  . Encounter for surveillance of contraceptive pills 09/12/2015  . Preventative health care 09/12/2015  . Epidermal cyst 11/21/2014  . H/O cold sores 11/21/2014  . Constipation 05/08/2013  . Fatigue 05/08/2013  . Generalized anxiety disorder 11/27/2012  . Vaginal delivery 05/06/2012  . Premature rupture of membranes 05/05/2012  . Swelling of both ankles 05/05/2012  . Supervision of other normal  pregnancy 11/12/2011  . Anxiety 07/19/2011   Past Medical History:  Diagnosis Date  . Anxiety   . Herpes labialis   . IBS (irritable bowel syndrome)    Past Surgical History:  Procedure Laterality Date  . bunyons    . MOLE REMOVAL    . WISDOM TOOTH EXTRACTION     Social History   Tobacco Use  . Smoking status: Never Smoker  . Smokeless tobacco: Never Used  Substance Use Topics  . Alcohol use: Yes    Comment: Occasionally   . Drug use: No   Family History  Problem Relation Age of Onset  . Stroke Maternal Grandmother   . Diabetes Maternal Grandmother   . Stroke Paternal Grandfather   . Cancer Maternal Grandfather 17       prostate cancer  . Heart disease Paternal Grandmother    No Known Allergies Current Outpatient Medications on File Prior to Visit  Medication Sig Dispense Refill  . Multiple Vitamin (MULTIVITAMIN) tablet Take 1 tablet by mouth daily.    . norethindrone-ethinyl estradiol (JUNEL 1/20) 1-20 MG-MCG tablet Take 1 tablet by mouth daily. 3 Package 3  . PARoxetine (PAXIL) 20 MG tablet Take 1 tablet (20 mg total) by mouth daily. 90 tablet 3  . valACYclovir (VALTREX) 1000 MG tablet At the onset of cold sores: take 2 tablets every 12 hours for 1 day. 12 tablet 0  . mupirocin ointment (BACTROBAN) 2 % Place 1  application into the nose 2 (two) times daily. (Patient not taking: Reported on 08/29/2018) 22 g 0   No current facility-administered medications on file prior to visit.      Observations/Objective: Patient appears fatigued but in no distress Weight is baseline  No facial swelling or asymmetry Normal voice-not hoarse and no slurred speech No obvious tremor or mobility impairment Moving neck and UEs normally Able to hear the call well  No cough or shortness of breath during interview  Talkative and mentally sharp with no cognitive changes No skin changes on face or neck , no rash or pallor Affect is normal    Assessment and Plan: Problem List Items  Addressed This Visit      Respiratory   URI (upper respiratory infection)    With fever/congestion and mild cough (no GI symptoms or change in smell or taste)  covid test ordered to get today  Fluids/rest/sympt care at home Isolate until neg test/symptom free  Update if not starting to improve in a week or if worsening        Relevant Orders   Novel Coronavirus, NAA ([facility])       Follow Up Instructions: [facility] location to get a covid test now  Wear a mask  Isolate from family and stay out of work Drink fluids/rest  Ibuprofen for fever/headache Zyrtec for nasal symptoms   Update if not starting to improve in a week or if worsening  If symptoms suddenly worsen-alert us and go to the ER We will check in with you   I discussed the assessment and treatment plan with the patient. The patient was provided an opportunity to ask questions and all were answered. The patient agreed with the plan and demonstrated an understanding of the instructions.   The patient was advised to call back or seek an in-person evaluation if the symptoms worsen or if the condition fails to improve as anticipated.     Roxy MannsMarne Deliyah Muckle, MD

## 2018-08-29 NOTE — Assessment & Plan Note (Signed)
With fever/congestion and mild cough (no GI symptoms or change in smell or taste)  covid test ordered to get today  Fluids/rest/sympt care at home Isolate until neg test/symptom free  Update if not starting to improve in a week or if worsening

## 2018-08-30 LAB — NOVEL CORONAVIRUS, NAA: SARS-CoV-2, NAA: DETECTED — AB

## 2018-10-09 ENCOUNTER — Telehealth: Payer: Self-pay

## 2018-10-09 DIAGNOSIS — Z8619 Personal history of other infectious and parasitic diseases: Secondary | ICD-10-CM

## 2018-10-09 MED ORDER — VALACYCLOVIR HCL 1 G PO TABS: ORAL_TABLET | ORAL | 0 refills | Status: DC

## 2018-10-09 NOTE — Telephone Encounter (Signed)
Pt left v/m requesting refill for valacyclovir for fever blisters. Pt request cb.

## 2018-10-09 NOTE — Telephone Encounter (Signed)
Last prescribed on 02/07/2018 . Last appointment on 08/29/2018 with Dr Glori Bickers. No future appointment

## 2018-10-09 NOTE — Telephone Encounter (Signed)
Noted, refill sent to pharmacy. 

## 2018-12-05 ENCOUNTER — Encounter: Payer: BC Managed Care – PPO | Admitting: Primary Care

## 2018-12-13 ENCOUNTER — Ambulatory Visit: Payer: BC Managed Care – PPO | Admitting: Primary Care

## 2019-01-09 ENCOUNTER — Ambulatory Visit: Payer: BC Managed Care – PPO | Admitting: Primary Care

## 2019-01-09 ENCOUNTER — Other Ambulatory Visit: Payer: BC Managed Care – PPO

## 2019-01-11 ENCOUNTER — Encounter: Payer: Self-pay | Admitting: Primary Care

## 2019-01-11 ENCOUNTER — Other Ambulatory Visit: Payer: Self-pay

## 2019-01-11 ENCOUNTER — Ambulatory Visit (INDEPENDENT_AMBULATORY_CARE_PROVIDER_SITE_OTHER): Payer: BC Managed Care – PPO | Admitting: Primary Care

## 2019-01-11 ENCOUNTER — Ambulatory Visit: Payer: BC Managed Care – PPO | Admitting: Primary Care

## 2019-01-11 VITALS — BP 108/72 | HR 82 | Temp 97.3°F | Ht 63.75 in | Wt 143.0 lb

## 2019-01-11 DIAGNOSIS — K589 Irritable bowel syndrome without diarrhea: Secondary | ICD-10-CM

## 2019-01-11 DIAGNOSIS — Z8619 Personal history of other infectious and parasitic diseases: Secondary | ICD-10-CM

## 2019-01-11 DIAGNOSIS — Z0000 Encounter for general adult medical examination without abnormal findings: Secondary | ICD-10-CM

## 2019-01-11 DIAGNOSIS — F411 Generalized anxiety disorder: Secondary | ICD-10-CM

## 2019-01-11 DIAGNOSIS — Z3041 Encounter for surveillance of contraceptive pills: Secondary | ICD-10-CM

## 2019-01-11 LAB — COMPREHENSIVE METABOLIC PANEL
ALT: 12 U/L (ref 0–35)
AST: 16 U/L (ref 0–37)
Albumin: 4 g/dL (ref 3.5–5.2)
Alkaline Phosphatase: 54 U/L (ref 39–117)
BUN: 17 mg/dL (ref 6–23)
CO2: 27 mEq/L (ref 19–32)
Calcium: 8.7 mg/dL (ref 8.4–10.5)
Chloride: 105 mEq/L (ref 96–112)
Creatinine, Ser: 0.88 mg/dL (ref 0.40–1.20)
GFR: 71.27 mL/min (ref >60.00)
Glucose, Bld: 86 mg/dL (ref 70–99)
Potassium: 3.5 mEq/L (ref 3.5–5.1)
Sodium: 137 mEq/L (ref 135–145)
Total Bilirubin: 0.5 mg/dL (ref 0.2–1.2)
Total Protein: 7 g/dL (ref 6.0–8.3)

## 2019-01-11 LAB — CBC
HCT: 41.5 % (ref 36.0–46.0)
Hemoglobin: 14 g/dL (ref 12.0–15.0)
MCHC: 33.7 g/dL (ref 30.0–36.0)
MCV: 91.2 fl (ref 78.0–100.0)
Platelets: 205 10*3/uL (ref 150.0–400.0)
RBC: 4.55 Mil/uL (ref 3.87–5.11)
RDW: 12.8 % (ref 11.5–15.5)
WBC: 5.6 10*3/uL (ref 4.0–10.5)

## 2019-01-11 LAB — TSH: TSH: 2.39 u[IU]/mL (ref 0.35–4.50)

## 2019-01-11 LAB — LIPID PANEL
Cholesterol: 147 mg/dL (ref 0–200)
HDL: 49.6 mg/dL (ref >39.00)
LDL Cholesterol: 78 mg/dL (ref 0–99)
NonHDL: 97.67
Total CHOL/HDL Ratio: 3
Triglycerides: 96 mg/dL (ref 0.0–149.0)
VLDL: 19.2 mg/dL (ref 0.0–40.0)

## 2019-01-11 MED ORDER — PAROXETINE HCL 20 MG PO TABS: 20.0000 mg | ORAL_TABLET | Freq: Every day | ORAL | 3 refills | Status: DC

## 2019-01-11 NOTE — Assessment & Plan Note (Signed)
Immunizations UTD. Pap smear UTD. Mammogram due next year. Encouraged a healthy diet and regular exercise. Exam today unremarkable. Labs pending.

## 2019-01-11 NOTE — Assessment & Plan Note (Signed)
Doing well on OCP's, menstrual cycles regular. Pap smear due in 2021.

## 2019-01-11 NOTE — Assessment & Plan Note (Signed)
Doing well on PRN Valtrex, no recent outbreaks.  Continue to monitor.

## 2019-01-11 NOTE — Assessment & Plan Note (Signed)
Doing well on current Paxil, continue same. Denies SI/HI.

## 2019-01-11 NOTE — Patient Instructions (Signed)
Stop by the lab prior to leaving today. I will notify you of your results once received.   Continue exercising. You should be getting 150 minutes of moderate intensity exercise weekly.  Continue to work on a healthy diet. Ensure you are consuming 64 ounces of water daily.  It was a pleasure to see you today!   Preventive Care 21-39 Years Old, Female Preventive care refers to visits with your health care provider and lifestyle choices that can promote health and wellness. This includes:  A yearly physical exam. This may also be called an annual well check.  Regular dental visits and eye exams.  Immunizations.  Screening for certain conditions.  Healthy lifestyle choices, such as eating a healthy diet, getting regular exercise, not using drugs or products that contain nicotine and tobacco, and limiting alcohol use. What can I expect for my preventive care visit? Physical exam Your health care provider will check your:  Height and weight. This may be used to calculate body mass index (BMI), which tells if you are at a healthy weight.  Heart rate and blood pressure.  Skin for abnormal spots. Counseling Your health care provider may ask you questions about your:  Alcohol, tobacco, and drug use.  Emotional well-being.  Home and relationship well-being.  Sexual activity.  Eating habits.  Work and work environment.  Method of birth control.  Menstrual cycle.  Pregnancy history. What immunizations do I need?  Influenza (flu) vaccine  This is recommended every year. Tetanus, diphtheria, and pertussis (Tdap) vaccine  You may need a Td booster every 10 years. Varicella (chickenpox) vaccine  You may need this if you have not been vaccinated. Human papillomavirus (HPV) vaccine  If recommended by your health care provider, you may need three doses over 6 months. Measles, mumps, and rubella (MMR) vaccine  You may need at least one dose of MMR. You may also need a  second dose. Meningococcal conjugate (MenACWY) vaccine  One dose is recommended if you are age 19-21 years and a first-year college student living in a residence hall, or if you have one of several medical conditions. You may also need additional booster doses. Pneumococcal conjugate (PCV13) vaccine  You may need this if you have certain conditions and were not previously vaccinated. Pneumococcal polysaccharide (PPSV23) vaccine  You may need one or two doses if you smoke cigarettes or if you have certain conditions. Hepatitis A vaccine  You may need this if you have certain conditions or if you travel or work in places where you may be exposed to hepatitis A. Hepatitis B vaccine  You may need this if you have certain conditions or if you travel or work in places where you may be exposed to hepatitis B. Haemophilus influenzae type b (Hib) vaccine  You may need this if you have certain conditions. You may receive vaccines as individual doses or as more than one vaccine together in one shot (combination vaccines). Talk with your health care provider about the risks and benefits of combination vaccines. What tests do I need?  Blood tests  Lipid and cholesterol levels. These may be checked every 5 years starting at age 20.  Hepatitis C test.  Hepatitis B test. Screening  Diabetes screening. This is done by checking your blood sugar (glucose) after you have not eaten for a while (fasting).  Sexually transmitted disease (STD) testing.  BRCA-related cancer screening. This may be done if you have a family history of breast, ovarian, tubal, or peritoneal cancers.    Pelvic exam and Pap test. This may be done every 3 years starting at age 25. Starting at age 88, this may be done every 5 years if you have a Pap test in combination with an HPV test. Talk with your health care provider about your test results, treatment options, and if necessary, the need for more tests. Follow these  instructions at home: Eating and drinking   Eat a diet that includes fresh fruits and vegetables, whole grains, lean protein, and low-fat dairy.  Take vitamin and mineral supplements as recommended by your health care provider.  Do not drink alcohol if: ? Your health care provider tells you not to drink. ? You are pregnant, may be pregnant, or are planning to become pregnant.  If you drink alcohol: ? Limit how much you have to 0-1 drink a day. ? Be aware of how much alcohol is in your drink. In the [place] one drink equals one 12 oz bottle of beer (355 mL), one 5 oz glass of wine (148 mL), or one 1 oz glass of hard liquor (44 mL). Lifestyle  Take daily care of your teeth and gums.  Stay active. Exercise for at least 30 minutes on 5 or more days each week.  Do not use any products that contain nicotine or tobacco, such as cigarettes, e-cigarettes, and chewing tobacco. If you need help quitting, ask your health care provider.  If you are sexually active, practice safe sex. Use a condom or other form of birth control (contraception) in order to prevent pregnancy and STIs (sexually transmitted infections). If you plan to become pregnant, see your health care provider for a preconception visit. What's next?  Visit your health care provider once a year for a well check visit.  Ask your health care provider how often you should have your eyes and teeth checked.  Stay up to date on all vaccines. This information is not intended to replace advice given to you by your health care provider. Make sure you discuss any questions you have with your health care provider. Document Released: 03/02/2001 Document Revised: 09/15/2017 Document Reviewed: 09/15/2017 Elsevier Patient Education  2020 [employer].

## 2019-01-11 NOTE — Progress Notes (Signed)
Subjective:    Patient ID: Stacey Barnett, female    DOB: 06/21/1980, 38 y.o.   MRN: [id number]  HPI  Stacey Barnett is a 38 year old female who presents today for complete physical.  Immunizations: -Tetanus: Completed in 2014 -Influenza: Completed this season   Diet: She endorses a fair diet. Exercise: She is exercising at home 3-4 times weekly, video.  Eye exam: No recent exam Dental exam: Completes semi-annually   Pap Smear: Completed in 2018  Wt Readings from Last 3 Encounters:  01/11/19 143 lb (64.9 kg)  02/27/18 155 lb 12 oz (70.6 kg)  02/07/18 159 lb 4 oz (72.2 kg)     Review of Systems  Constitutional: Negative for unexpected weight change.  HENT: Negative for rhinorrhea.   Respiratory: Negative for cough and shortness of breath.   Cardiovascular: Negative for chest pain.  Gastrointestinal: Negative for constipation and diarrhea.  Genitourinary: Negative for difficulty urinating and menstrual problem.  Musculoskeletal: Negative for arthralgias and myalgias.  Skin: Negative for rash.  Allergic/Immunologic: Negative for environmental allergies.  Neurological: Negative for dizziness, numbness and headaches.  Psychiatric/Behavioral: The patient is not nervous/anxious.        Past Medical History:  Diagnosis Date  . Anxiety   . Herpes labialis   . IBS (irritable bowel syndrome)      Social History   Socioeconomic History  . Marital status: Married    Spouse name: Stacey Barnett  . Number of children: 3  . Years of education: 41  . Highest education level: Not on file  Occupational History  . Occupation: Stay at home mother    Comment: Mother of 3 children  . Occupation: Pharmacist, hospital    Comment: Currently not working  Tobacco Use  . Smoking status: Never Smoker  . Smokeless tobacco: Never Used  Substance and Sexual Activity  . Alcohol use: Yes    Comment: Occasionally   . Drug use: No  . Sexual activity: Yes    Birth control/protection: Pill  Other Topics  Concern  . Not on file  Social History Narrative   Stacey Barnett grew up in [place], [place]. She attended [employer] in [place], [place] where she obtained her Bachelors Degree in [employer].    Works teaching second grade.   Enjoys going to the movies, getting pedicures.    Social Determinants of Health   Financial Resource Strain:   . Difficulty of Paying Living Expenses: Not on file  Food Insecurity:   . Worried About Charity fundraiser in the Last Year: Not on file  . Ran Out of Food in the Last Year: Not on file  Transportation Needs:   . Lack of Transportation (Medical): Not on file  . Lack of Transportation (Non-Medical): Not on file  Physical Activity:   . Days of Exercise per Week: Not on file  . Minutes of Exercise per Session: Not on file  Stress:   . Feeling of Stress : Not on file  Social Connections:   . Frequency of Communication with Friends and Family: Not on file  . Frequency of Social Gatherings with Friends and Family: Not on file  . Attends Religious Services: Not on file  . Active Member of Clubs or Organizations: Not on file  . Attends Archivist Meetings: Not on file  . Marital Status: Not on file  Intimate Partner Violence:   . Fear of Current or Ex-Partner: Not on file  . Emotionally Abused: Not on file  .  Physically Abused: Not on file  . Sexually Abused: Not on file    Past Surgical History:  Procedure Laterality Date  . bunyons    . MOLE REMOVAL    . WISDOM TOOTH EXTRACTION      Family History  Problem Relation Age of Onset  . Stroke Maternal Grandmother   . Diabetes Maternal Grandmother   . Stroke Paternal Grandfather   . Cancer Maternal Grandfather 62       prostate cancer  . Heart disease Paternal Grandmother     No Known Allergies  Current Outpatient Medications on File Prior to Visit  Medication Sig Dispense Refill  . Multiple Vitamin (MULTIVITAMIN) tablet Take 1 tablet by mouth daily.    .  norethindrone-ethinyl estradiol (JUNEL 1/20) 1-20 MG-MCG tablet Take 1 tablet by mouth daily. 3 Package 3  . valACYclovir (VALTREX) 1000 MG tablet At the onset of cold sores: take 2 tablets every 12 hours for 1 day. 12 tablet 0  . mupirocin ointment (BACTROBAN) 2 % Place 1 application into the nose 2 (two) times daily. (Patient not taking: Reported on 01/11/2019) 22 g 0   No current facility-administered medications on file prior to visit.    BP 108/72   Pulse 82   Temp (!) 97.3 F (36.3 C) (Temporal)   Ht 5' 3.75" (1.619 m)   Wt 143 lb (64.9 kg)   LMP 12/21/2018   SpO2 98%   BMI 24.74 kg/m    Objective:   Physical Exam  Constitutional: She is oriented to person, place, and time. She appears well-nourished.  HENT:  Right Ear: Tympanic membrane and ear canal normal.  Left Ear: Tympanic membrane and ear canal normal.  Mouth/Throat: Oropharynx is clear and moist.  Eyes: Pupils are equal, round, and reactive to light. EOM are normal.  Cardiovascular: Normal rate and regular rhythm.  Respiratory: Effort normal and breath sounds normal.  GI: Soft. Bowel sounds are normal. There is no abdominal tenderness.  Musculoskeletal:        General: Normal range of motion.     Cervical back: Neck supple.  Neurological: She is alert and oriented to person, place, and time. No cranial nerve deficit.  Reflex Scores:      Patellar reflexes are 2+ on the right side and 2+ on the left side. Skin: Skin is warm and dry.  Psychiatric: She has a normal mood and affect.           Assessment & Plan:

## 2019-01-11 NOTE — Assessment & Plan Note (Signed)
Intermittent, overall doing well, continue to monitor.

## 2019-02-21 ENCOUNTER — Other Ambulatory Visit: Payer: Self-pay | Admitting: Primary Care

## 2019-02-21 DIAGNOSIS — Z3041 Encounter for surveillance of contraceptive pills: Secondary | ICD-10-CM

## 2019-03-24 ENCOUNTER — Ambulatory Visit: Payer: BC Managed Care – PPO

## 2019-03-24 ENCOUNTER — Ambulatory Visit: Payer: BC Managed Care – PPO | Attending: Internal Medicine

## 2019-03-24 ENCOUNTER — Other Ambulatory Visit: Payer: Self-pay

## 2019-03-24 DIAGNOSIS — Z23 Encounter for immunization: Secondary | ICD-10-CM | POA: Insufficient documentation

## 2019-03-24 NOTE — Progress Notes (Signed)
   Covid-19 Vaccination Clinic  Name:  Stacey Barnett    MRN: [id number] DOB: Feb 24, 1980  03/24/2019  [name] was observed post Covid-19 immunization for 15 minutes without incident. She was provided with Vaccine Information Sheet and instruction to access the V-Safe system.   [name] was instructed to call 911 with any severe reactions post vaccine: Marland Kitchen Difficulty breathing  . Swelling of face and throat  . A fast heartbeat  . A bad rash all over body  . Dizziness and weakness   Immunizations Administered    Name Date Dose VIS Date Route   Pfizer COVID-19 Vaccine 03/24/2019 11:14 AM 0.3 mL 12/29/2018 Intramuscular   Manufacturer: [employer], [employer]   Lot: [id number]   NDC: [address]-4

## 2019-04-17 ENCOUNTER — Ambulatory Visit: Payer: BC Managed Care – PPO | Attending: Internal Medicine

## 2019-04-17 DIAGNOSIS — Z23 Encounter for immunization: Secondary | ICD-10-CM

## 2019-04-17 NOTE — Progress Notes (Signed)
   Covid-19 Vaccination Clinic  Name:  Stacey Barnett    MRN: [id number] DOB: Nov 05, 1980  04/17/2019  Stacey Barnett was observed post Covid-19 immunization for 15 minutes without incident. She was provided with Vaccine Information Sheet and instruction to access the V-Safe system.   Stacey Barnett was instructed to call 911 with any severe reactions post vaccine: Marland Kitchen Difficulty breathing  . Swelling of face and throat  . A fast heartbeat  . A bad rash all over body  . Dizziness and weakness   Immunizations Administered    Name Date Dose VIS Date Route   Pfizer COVID-19 Vaccine 04/17/2019  4:33 PM 0.3 mL 12/29/2018 Intramuscular   Manufacturer: [employer], [employer]   Lot: [id number]   NDC: [address]-3

## 2019-06-07 ENCOUNTER — Telehealth: Payer: Self-pay | Admitting: Primary Care

## 2019-06-07 NOTE — Telephone Encounter (Signed)
Pt states that she takes Junel 1/20 and for the last 2 months has been experiencing a headache about 2 days prior to starting her period, headache lasts about 4-5 days, lingers constantly all day. Pt states that she has tried Excedrin Migraine, Ibuprofen and even allergy medication with no relief of symptoms. HA is not debilitating, she is able to work but its aggravating. Uses a heating pad some days but this does not help either.  No changes in menstrual cycle - no breakthru bleeding, on schedule, lasts 2-3 days, no cramping  Please advise, thanks.

## 2019-06-08 NOTE — Telephone Encounter (Signed)
Office visit please

## 2019-06-12 NOTE — Telephone Encounter (Signed)
Attempted to reach pt to schedule. Lvm asking her to call the office.

## 2019-06-12 NOTE — Telephone Encounter (Signed)
Pt left v/m that she apologized for missing call and pt would cb after 3:30 when she gets off work.

## 2019-06-14 NOTE — Telephone Encounter (Signed)
Noted  

## 2019-06-14 NOTE — Telephone Encounter (Signed)
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 Pt scheduled in office appt with Allayne Gitelman NP on 06/25/19 at 8:40 AM. Pt has no covid symptoms, no travel and no known exposure to + covid. Pt does not have a H/A today. Pt said for the past 2 months pt gets H/A couple of days prior to menstrual period and then for 3 days during the menstrual cycle. LMP - 06/05/19 -06/07/19. Pt will cb or go to UC or ED if needed prior to appt due to worsening symptoms. Pt said she feels OK today. FYI to Allayne Gitelman NP.

## 2019-06-14 NOTE — Telephone Encounter (Signed)
Patient returned Rena's call. Patient can be reached at [phone].

## 2019-06-25 ENCOUNTER — Ambulatory Visit: Payer: BC Managed Care – PPO | Admitting: Primary Care

## 2019-06-25 ENCOUNTER — Other Ambulatory Visit: Payer: Self-pay

## 2019-06-25 ENCOUNTER — Encounter: Payer: Self-pay | Admitting: Primary Care

## 2019-06-25 DIAGNOSIS — G43829 Menstrual migraine, not intractable, without status migrainosus: Secondary | ICD-10-CM | POA: Insufficient documentation

## 2019-06-25 NOTE — Assessment & Plan Note (Signed)
Occurring a few days prior and also during menses x 2 months. She does have a history of migraines. Suspect hormonal given age and cyclical pattern.  She is scheduled to start menses next week, will message [place] if her headache occurs. Will trial daily propranolol ER 80 mg for prevention. She will update.

## 2019-06-25 NOTE — Patient Instructions (Signed)
Please message me next week if your headaches return.  It was a pleasure to see you today!

## 2019-06-25 NOTE — Progress Notes (Signed)
Subjective:    Patient ID: Stacey Barnett, female    DOB: Aug 04, 1980, 39 y.o.   MRN: [id number]  HPI  This visit occurred during the SARS-CoV-2 public health emergency.  Safety protocols were in place, including screening questions prior to the visit, additional usage of staff PPE, and extensive cleaning of exam room while observing appropriate contact time as indicated for disinfecting solutions.   Stacey Barnett is a 38 year old female with a history of IBS, migraines, GAD, fatigue who presents today with a chief complaint of headache.  She's begun to notice headaches over the last two months which occur five days in a row. Headaches occur two days prior to menstruation and will stop after she completes menses. She menstruates 2-3 days.   Her headache is located to either the right or left temporal lobe. She describes it as constant ache with mild throbbing. She denies photophobia, phonophobia, nausea during headaches.   She's taken Ibuprofen, allergy medication, Excedrin during headaches without improvement. She's compliant to her Junel and Paxil daily, denies missed doses. She denies changes in vision. She has noted an increased amount of stress and isn't sleeping well. LMP was in mid May 2021.  Review of Systems  Eyes: Negative for photophobia and visual disturbance.  Gastrointestinal: Negative for nausea.  Neurological: Positive for headaches. Negative for dizziness.  Psychiatric/Behavioral: The patient is not nervous/anxious.        Past Medical History:  Diagnosis Date  . Anxiety   . Herpes labialis   . IBS (irritable bowel syndrome)   . Migraines   . Premature rupture of membranes 05/05/2012  . Supervision of other normal pregnancy 11/12/2011  . Vaginal delivery 05/06/2012     Social History   Socioeconomic History  . Marital status: Married    Spouse name: Josh  . Number of children: 3  . Years of education: 29  . Highest education level: Not on file  Occupational  History  . Occupation: Stay at home mother    Comment: Mother of 3 children  . Occupation: Runner, broadcasting/film/video    Comment: Currently not working  Tobacco Use  . Smoking status: Never Smoker  . Smokeless tobacco: Never Used  Substance and Sexual Activity  . Alcohol use: Yes    Comment: Occasionally   . Drug use: No  . Sexual activity: Yes    Birth control/protection: Pill  Other Topics Concern  . Not on file  Social History Narrative   Stacey Barnett grew up in [place], [place]. She attended [employer] in [place], [place] where she obtained her Bachelors Degree in [employer].    Works teaching second grade.   Enjoys going to the movies, getting pedicures.    Social Determinants of Health   Financial Resource Strain:   . Difficulty of Paying Living Expenses:   Food Insecurity:   . Worried About Programme researcher, broadcasting/film/video in the Last Year:   . Barista in the Last Year:   Transportation Needs:   . Freight forwarder (Medical):   Marland Kitchen Lack of Transportation (Non-Medical):   Physical Activity:   . Days of Exercise per Week:   . Minutes of Exercise per Session:   Stress:   . Feeling of Stress :   Social Connections:   . Frequency of Communication with Friends and Family:   . Frequency of Social Gatherings with Friends and Family:   . Attends Religious Services:   . Active Member of Clubs or Organizations:   .  Attends Archivist Meetings:   Marland Kitchen Marital Status:   Intimate Partner Violence:   . Fear of Current or Ex-Partner:   . Emotionally Abused:   Marland Kitchen Physically Abused:   . Sexually Abused:     Past Surgical History:  Procedure Laterality Date  . bunyons    . MOLE REMOVAL    . WISDOM TOOTH EXTRACTION      Family History  Problem Relation Age of Onset  . Stroke Maternal Grandmother   . Diabetes Maternal Grandmother   . Stroke Paternal Grandfather   . Cancer Maternal Grandfather 39       prostate cancer  . Heart disease Paternal Grandmother     No Known  Allergies  Current Outpatient Medications on File Prior to Visit  Medication Sig Dispense Refill  . JUNEL 1/20 1-20 MG-MCG tablet TAKE 1 TABLET BY MOUTH EVERY DAY 63 tablet 3  . Multiple Vitamin (MULTIVITAMIN) tablet Take 1 tablet by mouth daily.    Marland Kitchen PARoxetine (PAXIL) 20 MG tablet Take 1 tablet (20 mg total) by mouth daily. 90 tablet 3  . valACYclovir (VALTREX) 1000 MG tablet At the onset of cold sores: take 2 tablets every 12 hours for 1 day. 12 tablet 0  . mupirocin ointment (BACTROBAN) 2 % Place 1 application into the nose 2 (two) times daily. (Patient not taking: Reported on 06/25/2019) 22 g 0   No current facility-administered medications on file prior to visit.    BP 112/70   Pulse 83   Temp (!) 96.6 F (35.9 C) (Temporal)   Ht 5' 3.75" (1.619 m)   Wt 140 lb (63.5 kg)   LMP 06/04/2019   SpO2 98%   BMI 24.22 kg/m    Objective:   Physical Exam  Constitutional: She appears well-nourished.  Eyes: EOM are normal.  Cardiovascular: Normal rate and regular rhythm.  Respiratory: Effort normal and breath sounds normal.  Musculoskeletal:     Cervical back: Neck supple.  Neurological: No cranial nerve deficit.  Skin: Skin is warm and dry.  Psychiatric: She has a normal mood and affect.           Assessment & Plan:

## 2019-08-02 ENCOUNTER — Telehealth: Payer: Self-pay | Admitting: *Deleted

## 2019-08-02 DIAGNOSIS — G43829 Menstrual migraine, not intractable, without status migrainosus: Secondary | ICD-10-CM

## 2019-08-02 MED ORDER — PROPRANOLOL HCL ER 80 MG PO CP24: 80.0000 mg | ORAL_CAPSULE | Freq: Every day | ORAL | 0 refills | Status: DC

## 2019-08-02 NOTE — Telephone Encounter (Signed)
Pt left VM at Triage. Pt said she saw PCP last month and they discussed a preventive med pt can take to help Migraines before her periods. At the time of the visit pt didn't think she needed it so PCP didn't prescribe anything. Pt said she is still having Migraines and does think she needs the preventive med before her periods. (Pt didn't leave pharmacy in VM)

## 2019-08-02 NOTE — Telephone Encounter (Signed)
Spoken and notified patient of Kate Clark's comments. Verbalized understanding. ° °

## 2019-08-02 NOTE — Telephone Encounter (Signed)
Please notify patient that I sent in a prescription for propranolol ER 80 mg to the pharmacy for migraine/headache prevention. I recommend she take this daily. I sent in 30 capsules for her to try, have her update [place] in 3-4 weeks, we can send in more if she finds this effective.

## 2019-08-13 ENCOUNTER — Telehealth: Payer: Self-pay | Admitting: *Deleted

## 2019-08-13 NOTE — Telephone Encounter (Signed)
I use this medication quite often for headache prevention, and I've had no one mention any problems or bad side effects. She is on a low dose. I believe she will do just fine.

## 2019-08-13 NOTE — Telephone Encounter (Signed)
Spoken and notified patient of Kate Clark's comments. Verbalized understanding. ° °

## 2019-08-13 NOTE — Telephone Encounter (Signed)
Will update if problem occurs

## 2019-08-13 NOTE — Telephone Encounter (Signed)
Patient left a voicemail stating that she was given propranolol for her headaches. Patient stated that she has not started the medication yet. Patient stated that she was to take it around her cycle. Patient stated that she has read the side effects and is worried about the side effects. Patient stated that she would like a call back to ease her mind about the side effects.

## 2019-08-27 ENCOUNTER — Telehealth: Payer: Self-pay | Admitting: *Deleted

## 2019-08-27 NOTE — Telephone Encounter (Signed)
This can cause some fatigue, shouldn't be changing mood really though. If has persisted this long, will likely continue. Would recommend following up with PCP to discuss medication management once she returns.

## 2019-08-27 NOTE — Telephone Encounter (Signed)
Patient called stating that she recently started taking Propranolol to help prevent headaches. Patient stated that it is making her really tired and cranky. 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 Patient wants to know if you think that this will improve and she should continue to try it for another month. Patient wants to know if you think her side effects will improve the longer she takes the medication. Patient requested a call back.

## 2019-08-28 NOTE — Telephone Encounter (Signed)
Noted  

## 2019-08-28 NOTE — Telephone Encounter (Signed)
Spoken to patient and she stated that she would like to wean off propranolol then discuss with Jae Dire when she returns, please advise.

## 2019-08-30 ENCOUNTER — Other Ambulatory Visit: Payer: Self-pay | Admitting: Primary Care

## 2019-08-30 DIAGNOSIS — G43829 Menstrual migraine, not intractable, without status migrainosus: Secondary | ICD-10-CM

## 2019-09-05 NOTE — Telephone Encounter (Signed)
Spoken to patient earlier today. She stated that after giving the medication some time, she is doing much better, no headache at this time when she has her period. She stated that she will give it another month just make sure but if problem, will call

## 2019-09-05 NOTE — Telephone Encounter (Signed)
Please notify patient that we can try a different medication for headache prevention called Topamax. This is taken EVERY NIGHT at bedtime for migraine/headache prevention. Does she want to try?

## 2019-09-05 NOTE — Telephone Encounter (Signed)
Noted, will await an update.

## 2019-09-25 ENCOUNTER — Telehealth: Payer: Self-pay | Admitting: *Deleted

## 2019-09-25 ENCOUNTER — Other Ambulatory Visit: Payer: Self-pay | Admitting: Primary Care

## 2019-09-25 DIAGNOSIS — G43829 Menstrual migraine, not intractable, without status migrainosus: Secondary | ICD-10-CM

## 2019-09-25 NOTE — Telephone Encounter (Signed)
Patient left a voicemail stating that she was given medication for her headaches. Patient stated that she thought that it was going to work, but it is not. Patient wants to know if there is something else that can be prescribed? [facility]

## 2019-09-26 NOTE — Telephone Encounter (Signed)
Was the propranolol helping initially? If so then I recommend we increase the dose. If not then I recommend we try a medication called Topamax.

## 2019-09-26 NOTE — Telephone Encounter (Signed)
Propanolol did help after a while.  Then about the 3rd month headaches started coming back.  She denies any missed pills.  Headaches have decreased until this month and they are mainly hormonal.  She takes ibuprofen but it does not seem to be helping.  No history of rescue meds.  Patient would like to know if there is something she can just take when a headache comes on or is it best to do something daily?

## 2019-09-27 NOTE — Telephone Encounter (Signed)
Patient is ok with increasing propranolol, but it makes her tired.  She says she only gets headaches a couple times a month so she was wondering if maybe she should just take something as needed as opposed to daily.  She is also worried about weight gain with medications.

## 2019-09-27 NOTE — Telephone Encounter (Signed)
Noted, attempted to call patient to discuss further, no answer voicemail left. We will send my chart message.

## 2019-09-27 NOTE — Telephone Encounter (Signed)
If her headaches have improved, then it could be secondary to the propranolol medication. If headaches are more than 3 times weekly then I recommend daily prevention. We can try a dose increase of the propranolol, she is on a small dose now.

## 2019-10-02 NOTE — Telephone Encounter (Signed)
Pt stated she had been taking her meds daily  She has had a headache a couple times.  She is wanting something likea rescue med  only for the 4 days of her headache.  Also on her propanolol she wanted to know if she needs to come off this gradually instead of cold [place].   [facility]

## 2019-10-03 NOTE — Telephone Encounter (Signed)
Please notify patient that there are many options for "as needed" headache treatment that she has not already tried. We can try adjusting her birth control dose to see if that helps prevent headaches.  Does she want to try this?

## 2019-10-05 NOTE — Telephone Encounter (Signed)
Sent to patient in mychart message.

## 2019-11-12 ENCOUNTER — Other Ambulatory Visit: Payer: Self-pay | Admitting: Primary Care

## 2019-11-12 DIAGNOSIS — Z3041 Encounter for surveillance of contraceptive pills: Secondary | ICD-10-CM

## 2019-11-14 NOTE — Telephone Encounter (Signed)
Pt left v/m that she cannot get into my chart messages; pt request cb about request to have med for the 2 days pt has H/A.

## 2019-11-15 NOTE — Telephone Encounter (Signed)
My recommendation would be to change her birth control strength to see if that helps in the long run. There really aren't many other options for what she's requesting.  If she has symptoms of nausea, sensitivity to light/sound during headaches then we can try sumatriptan 25 mg. Take 1 tablet at migraine/headacehe onset, may take second dose 2 hours later if headache persists. This may cause drowsiness.  Ultimately, we need to switch the level of hormones in her birth control to see if we can stop this headache cycle.  Another option would be to send her to a headache clinic.

## 2019-11-23 ENCOUNTER — Telehealth: Payer: Self-pay | Admitting: Primary Care

## 2019-11-23 NOTE — Telephone Encounter (Signed)
Pt called about her headaches , had asked if she can get a rescue drug.  Wanted to know about what she can take. Please advise , she wants something to take

## 2019-11-24 NOTE — Telephone Encounter (Signed)
Do you need her to come in for app?

## 2019-11-25 NOTE — Telephone Encounter (Signed)
Tell her that we can try sumatriptan for migraine abortion. This is taken at migraine onset. May repeat with second dose 2 hours later if needed. This may cause drowsiness.  Is she okay with this? If so then okay to send in sumatriptan 50 mg, #10, no refills.

## 2019-11-26 ENCOUNTER — Other Ambulatory Visit: Payer: Self-pay | Admitting: Primary Care

## 2019-11-26 ENCOUNTER — Other Ambulatory Visit: Payer: Self-pay

## 2019-11-26 DIAGNOSIS — G43829 Menstrual migraine, not intractable, without status migrainosus: Secondary | ICD-10-CM

## 2019-11-26 MED ORDER — SUMATRIPTAN SUCCINATE 50 MG PO TABS: 50.0000 mg | ORAL_TABLET | ORAL | 0 refills | Status: DC | PRN

## 2019-11-26 MED ORDER — SUMATRIPTAN SUCCINATE 50 MG PO TABS: ORAL_TABLET | ORAL | 0 refills | Status: DC

## 2019-11-26 NOTE — Telephone Encounter (Signed)
Thank you.  I resent the prescription with the correct directions.

## 2019-11-26 NOTE — Telephone Encounter (Signed)
Spoke to patient reviewed instructions. She would like to have called in. I have done so and reviewed instructions and information. Will call if any questions. Called in to local pharmacy per patient request.

## 2019-12-19 ENCOUNTER — Telehealth: Payer: Self-pay

## 2019-12-19 NOTE — Telephone Encounter (Signed)
I didn't realize that she was STILL taking propranolol. I thought she stopped this a long time ago. Please have patient scheduled with me virtually or in person ASAP as there seems to be a huge gap in communication between [place].

## 2019-12-19 NOTE — Telephone Encounter (Signed)
Pt left v/m that pt wants to know how she should ween off the propranolol because pt does not think that propranolol ER 80 mg  is helping headaches now; not on current med list but is on historical med list last given # 90 on 09/25/2019. Pt does not want to take the propranolol if it is not going to help headaches. Pt is also considering stopping BC pill (Junel 1-20 mg-mcg) as well but pt does not think she has to ween herself off BC pill but thinks she can just stop the [facility] pill.  Pt last seen 06/25/19.pt request cb. Pt does not have future appt scheduled.

## 2019-12-20 NOTE — Telephone Encounter (Signed)
Called patient made virtual with patient for Monday.

## 2019-12-24 ENCOUNTER — Encounter: Payer: Self-pay | Admitting: Primary Care

## 2019-12-24 ENCOUNTER — Other Ambulatory Visit: Payer: Self-pay

## 2019-12-24 ENCOUNTER — Telehealth (INDEPENDENT_AMBULATORY_CARE_PROVIDER_SITE_OTHER): Payer: BC Managed Care – PPO | Admitting: Primary Care

## 2019-12-24 DIAGNOSIS — G43829 Menstrual migraine, not intractable, without status migrainosus: Secondary | ICD-10-CM | POA: Diagnosis not present

## 2019-12-24 NOTE — Progress Notes (Signed)
Subjective:    Patient ID: Stacey Barnett, female    DOB: May 05, 1980, 39 y.o.   MRN: [id number]  HPI  Virtual Visit via Video Note  I connected with Lajean Boese on 12/24/19 at  2:40 PM EST by a video enabled telemedicine application and verified that I am speaking with the correct person using two identifiers.  Location: Patient: Work Provider: Office Participants: Patient and myself   I discussed the limitations of evaluation and management by telemedicine and the availability of in person appointments. The patient expressed understanding and agreed to proceed.  History of Present Illness:  [name] is a 39 year old female with a history of migraines/headaches, GAD who presents today to discuss migraines.  Prior history of migraines, but has not been bothered by them until summer 2021.  She was last evaluated for migraines at that time, reported cyclical migraines occurring 4-5 consecutive days with menses.  At the time she was prescribed propanolol ER 80 mg daily for prevention, was already managed on birth control.  Early November she was prescribed sumatriptan 50 mg to use for migraine abortion, was advised to stop her panel.    Today she has continued to take propanolol, she's now on propranolol 20 mg daily, has been taking for months and doesn't feel that its effective, she continues to experience monthly migraines for about four consecutive days around menses.   Last week she took one of her sumatriptan tablets in the evening, feel asleep, woke up the next day and had a migraine. She took another dose of her sumatriptan 50 mg and had complete resolve.  She started weaning herself off of propanolol last week by taking 1 tablet every other day.  Today she has noticed a headache, but has not had 1 since last week.   She is planning on coming off of her birth control as her husband had a vasectomy, she is wondering if birth control is playing a role in her headaches.  She does not  prefer to take daily medication for headache prevention.   Observations/Objective:  Alert and oriented. Appears well, not sickly. No distress. Speaking in complete sentences.   Assessment and Plan:  See problem based charting.  Follow Up Instructions:  Stop taking propanolol for headaches.  Use the sumatriptan as needed for migraines.  Take 1 tablet at migraine onset, may repeat with a second dose 2 hours later if migraine persists.  Please update me if you notice an increase in headaches, require use of the sumatriptan more than 2-3 times monthly.  It was a pleasure to see you today! Mayra Reel, NP-C    I discussed the assessment and treatment plan with the patient. The patient was provided an opportunity to ask questions and all were answered. The patient agreed with the plan and demonstrated an understanding of the instructions.   The patient was advised to call back or seek an in-person evaluation if the symptoms worsen or if the condition fails to improve as anticipated.    [name], NP    Review of Systems  Eyes: Positive for photophobia. Negative for visual disturbance.  Respiratory: Negative for shortness of breath.   Cardiovascular: Negative for chest pain.  Neurological: Positive for headaches.  Psychiatric/Behavioral: Negative for self-injury.       Past Medical History:  Diagnosis Date  . Anxiety   . Herpes labialis   . IBS (irritable bowel syndrome)   . Migraines   . Premature rupture of membranes  05/05/2012  . Supervision of other normal pregnancy 11/12/2011  . Vaginal delivery 05/06/2012     Social History   Socioeconomic History  . Marital status: Married    Spouse name: Josh  . Number of children: 3  . Years of education: 41  . Highest education level: Not on file  Occupational History  . Occupation: Stay at home mother    Comment: Mother of 3 children  . Occupation: Runner, broadcasting/film/video    Comment: Currently not working  Tobacco Use  .  Smoking status: Never Smoker  . Smokeless tobacco: Never Used  Substance and Sexual Activity  . Alcohol use: Yes    Comment: Occasionally   . Drug use: No  . Sexual activity: Yes    Birth control/protection: Pill  Other Topics Concern  . Not on file  Social History Narrative   Stacey Barnett grew up in [place], [place]. She attended [employer] in [place], [place] where she obtained her Bachelors Degree in [employer].    Works teaching second grade.   Enjoys going to the movies, getting pedicures.    Social Determinants of Health   Financial Resource Strain:   . Difficulty of Paying Living Expenses: Not on file  Food Insecurity:   . Worried About Programme researcher, broadcasting/film/video in the Last Year: Not on file  . Ran Out of Food in the Last Year: Not on file  Transportation Needs:   . Lack of Transportation (Medical): Not on file  . Lack of Transportation (Non-Medical): Not on file  Physical Activity:   . Days of Exercise per Week: Not on file  . Minutes of Exercise per Session: Not on file  Stress:   . Feeling of Stress : Not on file  Social Connections:   . Frequency of Communication with Friends and Family: Not on file  . Frequency of Social Gatherings with Friends and Family: Not on file  . Attends Religious Services: Not on file  . Active Member of Clubs or Organizations: Not on file  . Attends Banker Meetings: Not on file  . Marital Status: Not on file  Intimate Partner Violence:   . Fear of Current or Ex-Partner: Not on file  . Emotionally Abused: Not on file  . Physically Abused: Not on file  . Sexually Abused: Not on file    Past Surgical History:  Procedure Laterality Date  . bunyons    . MOLE REMOVAL    . WISDOM TOOTH EXTRACTION      Family History  Problem Relation Age of Onset  . Stroke Maternal Grandmother   . Diabetes Maternal Grandmother   . Stroke Paternal Grandfather   . Cancer Maternal Grandfather 67       prostate cancer  . Heart  disease Paternal Grandmother     No Known Allergies  Current Outpatient Medications on File Prior to Visit  Medication Sig Dispense Refill  . JUNEL 1/20 1-20 MG-MCG tablet TAKE 1 TABLET BY MOUTH EVERY DAY 63 tablet 3  . Multiple Vitamin (MULTIVITAMIN) tablet Take 1 tablet by mouth daily.    . mupirocin ointment (BACTROBAN) 2 % Place 1 application into the nose 2 (two) times daily. 22 g 0  . PARoxetine (PAXIL) 20 MG tablet Take 1 tablet (20 mg total) by mouth daily. 90 tablet 3  . SUMAtriptan (IMITREX) 50 MG tablet Take 1 tablet by mouth at migraine onset. May repeat in 2 hours if headache persists or recurs. 10 tablet 0  . valACYclovir (  VALTREX) 1000 MG tablet At the onset of cold sores: take 2 tablets every 12 hours for 1 day. 12 tablet 0   No current facility-administered medications on file prior to visit.    Ht 5' 3.7" (1.618 m)   Wt 140 lb (63.5 kg)   BMI 24.26 kg/m    Objective:   Physical Exam Constitutional:      General: She is not in acute distress. Pulmonary:     Effort: Pulmonary effort is normal.  Neurological:     Mental Status: She is alert and oriented to person, place, and time.            Assessment & Plan:

## 2019-12-24 NOTE — Patient Instructions (Signed)
Stop taking propanolol for headaches.  Use the sumatriptan as needed for migraines.  Take 1 tablet at migraine onset, may repeat with a second dose 2 hours later if migraine persists.  Please update me if you notice an increase in headaches, require use of the sumatriptan more than 2-3 times monthly.  It was a pleasure to see you today! Mayra Reel, NP-C

## 2019-12-24 NOTE — Assessment & Plan Note (Signed)
No improvement with propanolol.  She did have relief from sumatriptan.  For now we will continue with sumatriptan as needed monthly.  She will also come off of birth control in a few months, discussed when to stop.  She will update if headaches increase in frequency, and or if she requires use of sumatriptan frequently.

## 2020-01-02 ENCOUNTER — Other Ambulatory Visit: Payer: Self-pay | Admitting: Primary Care

## 2020-01-02 DIAGNOSIS — G43829 Menstrual migraine, not intractable, without status migrainosus: Secondary | ICD-10-CM

## 2020-01-16 ENCOUNTER — Other Ambulatory Visit: Payer: Self-pay | Admitting: Primary Care

## 2020-01-16 DIAGNOSIS — F411 Generalized anxiety disorder: Secondary | ICD-10-CM

## 2020-01-16 NOTE — Telephone Encounter (Signed)
Pharmacy requests refill on: Paroxetine 20 mg  LAST REFILL: 01/11/2019 (Q-90, R-3) LAST OV: 12/24/2019 NEXT OV: Not Scheduled  PHARMACY: [id number] [place], [place]

## 2020-02-14 ENCOUNTER — Other Ambulatory Visit: Payer: Self-pay

## 2020-02-14 DIAGNOSIS — G43829 Menstrual migraine, not intractable, without status migrainosus: Secondary | ICD-10-CM

## 2020-02-14 MED ORDER — SUMATRIPTAN SUCCINATE 50 MG PO TABS: ORAL_TABLET | ORAL | 0 refills | Status: DC

## 2020-02-14 NOTE — Telephone Encounter (Signed)
Pt requesting refill for sumatriptan.  Says it really helps with headaches PRN, about twice a month.  Only has 2 pills left.

## 2020-02-14 NOTE — Telephone Encounter (Signed)
Ok to refill or is she using to frequently?

## 2020-02-14 NOTE — Telephone Encounter (Signed)
Refill(s) sent to pharmacy. Glad to know it's working!

## 2020-03-25 ENCOUNTER — Other Ambulatory Visit: Payer: Self-pay | Admitting: *Deleted

## 2020-03-25 DIAGNOSIS — Z8619 Personal history of other infectious and parasitic diseases: Secondary | ICD-10-CM

## 2020-03-25 NOTE — Telephone Encounter (Signed)
Patient left a voicemail requesting a refill on her generic valtrex. Patient stated that it works real well for her fever blisters. Patient stated that she had a couple left over to use over the weekend. Patient stated that she is completely out now. [facility]

## 2020-03-26 MED ORDER — VALACYCLOVIR HCL 1 G PO TABS: ORAL_TABLET | ORAL | 0 refills | Status: DC

## 2020-03-26 NOTE — Telephone Encounter (Signed)
Patient really needs a general follow-up with me scheduled. 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

## 2020-03-27 NOTE — Telephone Encounter (Signed)
Called patient to schedule. Stated she will call back when available.

## 2020-03-31 ENCOUNTER — Telehealth: Payer: Self-pay | Admitting: *Deleted

## 2020-03-31 DIAGNOSIS — B3731 Acute candidiasis of vulva and vagina: Secondary | ICD-10-CM

## 2020-03-31 DIAGNOSIS — B373 Candidiasis of vulva and vagina: Secondary | ICD-10-CM

## 2020-03-31 MED ORDER — FLUCONAZOLE 150 MG PO TABS: 150.0000 mg | ORAL_TABLET | Freq: Once | ORAL | 0 refills | Status: AC

## 2020-03-31 NOTE — Telephone Encounter (Signed)
Mychart to patient  Medication sent to pharmacy

## 2020-03-31 NOTE — Telephone Encounter (Signed)
Patient called stating that she was seen about two weeks ago at [facility] and was put on a high dose of antibiotic. Patient stated that she has been off of the antibiotic for about a week. Patient stated that she is sure that she has a yeast infection. Patient stated that she is having vaginal itching and slight burning. Patient stated that she is not having any more of a discharge than she ususally has. Patient stated that she has tried some OTC cream for itching but it has not helped. Patient wants to know if you can prescribe her something for a yeast infection.  [facility]

## 2020-04-14 ENCOUNTER — Ambulatory Visit (INDEPENDENT_AMBULATORY_CARE_PROVIDER_SITE_OTHER): Payer: BC Managed Care – PPO | Admitting: Obstetrics and Gynecology

## 2020-04-14 ENCOUNTER — Other Ambulatory Visit: Payer: Self-pay

## 2020-04-14 ENCOUNTER — Encounter: Payer: Self-pay | Admitting: Obstetrics and Gynecology

## 2020-04-14 VITALS — BP 106/80 | Ht 64.0 in | Wt 153.0 lb

## 2020-04-14 DIAGNOSIS — G43839 Menstrual migraine, intractable, without status migrainosus: Secondary | ICD-10-CM

## 2020-04-14 DIAGNOSIS — Z1231 Encounter for screening mammogram for malignant neoplasm of breast: Secondary | ICD-10-CM | POA: Diagnosis not present

## 2020-04-14 NOTE — Progress Notes (Signed)
Stacey Nest, NP   Chief Complaint  Patient presents with  . Menstrual Problem    Headaches with cycles x 8 months    HPI:      Stacey Barnett is a 40 y.o. [id number] whose LMP was Patient's last menstrual period was 04/09/2020 (exact date)., presents today for NP eval of menstrual migraines for the past 8 months. Mense are monthly with OCPs, lasting 2-3 days, light to mod flow, no BTB. Headache starts somewhere on placebo pills and stops any time during that week, not necessarily with bleeding or starting active pills. Has mild phono/photophobia, no vomiting, occas nausea. Had to miss 2 days work last wk. PCP has given pt sumatriptan which usually works, but didn't last wk. Sleep works the best. Has headaches other times during the month but not like the menstrual headaches. No aura with headaches.  Current on pap with PCP. No recent mammo. Has implants. No FH breast ca.    Past Medical History:  Diagnosis Date  . Anxiety   . Herpes labialis   . IBS (irritable bowel syndrome)   . Migraines   . Premature rupture of membranes 05/05/2012  . Supervision of other normal pregnancy 11/12/2011  . Vaginal delivery 05/06/2012    Past Surgical History:  Procedure Laterality Date  . bunyons    . MOLE REMOVAL    . WISDOM TOOTH EXTRACTION      Family History  Problem Relation Age of Onset  . Stroke Maternal Grandmother   . Diabetes Maternal Grandmother   . Stroke Paternal Grandfather   . Cancer Maternal Grandfather 45       prostate cancer  . Heart disease Paternal Grandmother     Social History   Socioeconomic History  . Marital status: Married    Spouse name: Josh  . Number of children: 3  . Years of education: 70  . Highest education level: Not on file  Occupational History  . Occupation: Stay at home mother    Comment: Mother of 3 children  . Occupation: Runner, broadcasting/film/video    Comment: Currently not working  Tobacco Use  . Smoking status: Never Smoker  . Smokeless  tobacco: Never Used  Vaping Use  . Vaping Use: Never used  Substance and Sexual Activity  . Alcohol use: Yes    Comment: Occasionally   . Drug use: No  . Sexual activity: Yes    Birth control/protection: Pill  Other Topics Concern  . Not on file  Social History Narrative   Stacey Barnett grew up in [place], [place]. She attended [employer] in [place], [place] where she obtained her Bachelors Degree in [employer].    Works teaching second grade.   Enjoys going to the movies, getting pedicures.    Social Determinants of Health   Financial Resource Strain: Not on file  Food Insecurity: Not on file  Transportation Needs: Not on file  Physical Activity: Not on file  Stress: Not on file  Social Connections: Not on file  Intimate Partner Violence: Not on file    Outpatient Medications Prior to Visit  Medication Sig Dispense Refill  . JUNEL 1/20 1-20 MG-MCG tablet TAKE 1 TABLET BY MOUTH EVERY DAY 63 tablet 3  . Multiple Vitamin (MULTIVITAMIN) tablet Take 1 tablet by mouth daily.    . mupirocin ointment (BACTROBAN) 2 % Place 1 application into the nose 2 (two) times daily. 22 g 0  . PARoxetine (PAXIL) 20 MG tablet TAKE 1 TABLET BY MOUTH  EVERY DAY 90 tablet 3  . SUMAtriptan (IMITREX) 50 MG tablet Take 1 tablet by mouth at migraine onset. May repeat in 2 hours if headache persists or recurs. 10 tablet 0  . valACYclovir (VALTREX) 1000 MG tablet At the onset of cold sores: take 2 tablets every 12 hours for 1 day. 8 tablet 0   No facility-administered medications prior to visit.      ROS:  Review of Systems  Constitutional: Negative for fever.  Gastrointestinal: Negative for blood in stool, constipation, diarrhea, nausea and vomiting.  Genitourinary: Negative for dyspareunia, dysuria, flank pain, frequency, hematuria, urgency, vaginal bleeding, vaginal discharge and vaginal pain.  Musculoskeletal: Negative for back pain.  Skin: Negative for rash.  Neurological: Positive for  headaches.  BREAST: No symptoms   OBJECTIVE:   Vitals:  BP 106/80   Ht 5\' 4"  (1.626 m)   Wt 153 lb (69.4 kg)   LMP 04/09/2020 (Exact Date)   BMI 26.26 kg/m   Physical Exam Vitals reviewed.  Constitutional:      Appearance: She is well-developed.  Pulmonary:     Effort: Pulmonary effort is normal.  Musculoskeletal:        General: Normal range of motion.     Cervical back: Normal range of motion.  Skin:    General: Skin is warm and dry.  Neurological:     General: No focal deficit present.     Mental Status: She is alert and oriented to person, place, and time.     Cranial Nerves: No cranial nerve deficit.  Psychiatric:        Mood and Affect: Mood normal.        Behavior: Behavior normal.        Thought Content: Thought content normal.        Judgment: Judgment normal.     Assessment/Plan: Intractable menstrual migraine without status migrainosus--given pt's sx are only on placebo pills, will do cont dosing of OCPs. Can go as long as she can without BTB, then do placebo pills. If this doesn't work, can try Lo Loestrin to decrease estrogen dose even further. F/u prn. Will rewrite OCP Rx for continuous dosing prn.   Encounter for screening mammogram for malignant neoplasm of breast - Plan: MM 3D SCREEN BREAST BILATERAL; pt to sched mammo.     Return if symptoms worsen or fail to improve.  Aamari West B. Darthy Manganelli, PA-C 04/14/2020 4:18 PM

## 2020-04-14 NOTE — Patient Instructions (Signed)
I value your feedback and you entrusting us with your care. If you get a [facility] patient survey, I would appreciate you taking the time to let us know about your experience today. Thank you! ? ? ?

## 2020-06-11 ENCOUNTER — Other Ambulatory Visit: Payer: Self-pay | Admitting: Primary Care

## 2020-06-11 DIAGNOSIS — G43829 Menstrual migraine, not intractable, without status migrainosus: Secondary | ICD-10-CM

## 2020-06-12 NOTE — Telephone Encounter (Signed)
Patient is due for follow up in June or July, this will be required prior to any further refills.  Please schedule.

## 2020-06-19 NOTE — Telephone Encounter (Signed)
Called and asked the patient to call us back needed clarification on her medications and some info she provided to another rep. Please give her to me if she calls back. EM

## 2020-06-19 NOTE — Telephone Encounter (Signed)
Patient was called about setting up appt for refill for Sumatriptan by Alphonzo Dublin. Patient states that it is helping her. She refused appt for right now and would call back if she need to come in. Patient provided additional info to rep that was told to her from the OBGYN. Morrie Sheldon was unclear of what she was saying and asked that I call her back to help with clarification. Being this is a clinical matter I am passing this off to the CMA to clarify what the OBGYN told her. EM

## 2020-06-19 NOTE — Telephone Encounter (Signed)
Patient called back and disconnected before I could get to her. EM

## 2020-06-19 NOTE — Telephone Encounter (Signed)
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

## 2020-06-20 NOTE — Telephone Encounter (Signed)
Stacey Barnett, will you make sure she received the refill of sumatriptan? Just let her know that she's fine to wait until she's on Summer break for follow up, I just need to see her IN OFFICE once annually and she'll be due around that time. That's all.

## 2020-06-23 NOTE — Telephone Encounter (Signed)
Called patient she still has 5 on hand just wanted to make sure she could have one on hold if needed. Have made CPE app with patient for summer. Will call if any issues.

## 2020-06-30 ENCOUNTER — Other Ambulatory Visit: Payer: Self-pay | Admitting: *Deleted

## 2020-06-30 DIAGNOSIS — Z8619 Personal history of other infectious and parasitic diseases: Secondary | ICD-10-CM

## 2020-06-30 NOTE — Telephone Encounter (Signed)
Patient left a voicemail requesting a refill on Valtrex. Patient stated that she only has two left and they really help when she gets a fever blister. Patient stated that she would like to have them on hand because they really help. Pharmacy [facility] Last refill 03/26/20 #8 Last office visit video 12/24/19 Upcoming appointment 07/31/20

## 2020-07-01 MED ORDER — VALACYCLOVIR HCL 1 G PO TABS
ORAL_TABLET | ORAL | 0 refills | Status: DC
Start: 2020-07-01 — End: 2020-08-29

## 2020-07-01 NOTE — Telephone Encounter (Signed)
Refills sent to pharmacy. 

## 2020-07-16 ENCOUNTER — Telehealth: Payer: Self-pay | Admitting: Primary Care

## 2020-07-16 NOTE — Telephone Encounter (Signed)
Called information given to patient she is aware we will do at next appointment in July. No further action at this time.

## 2020-07-16 NOTE — Telephone Encounter (Signed)
Stacey Barnett called and wanted to know when her last pap smear was

## 2020-07-30 ENCOUNTER — Other Ambulatory Visit: Payer: Self-pay | Admitting: Primary Care

## 2020-07-30 DIAGNOSIS — G43829 Menstrual migraine, not intractable, without status migrainosus: Secondary | ICD-10-CM

## 2020-07-31 ENCOUNTER — Ambulatory Visit: Payer: BC Managed Care – PPO | Admitting: Primary Care

## 2020-08-05 ENCOUNTER — Ambulatory Visit
Admission: RE | Admit: 2020-08-05 | Discharge: 2020-08-05 | Disposition: A | Discharge status: Home / Self Care | Payer: BC Managed Care – PPO | Source: Ambulatory Visit | Attending: Obstetrics and Gynecology | Admitting: Obstetrics and Gynecology

## 2020-08-05 ENCOUNTER — Other Ambulatory Visit: Payer: Self-pay

## 2020-08-05 ENCOUNTER — Other Ambulatory Visit: Payer: Self-pay | Admitting: Obstetrics and Gynecology

## 2020-08-05 DIAGNOSIS — Z1231 Encounter for screening mammogram for malignant neoplasm of breast: Secondary | ICD-10-CM | POA: Diagnosis present

## 2020-08-08 ENCOUNTER — Other Ambulatory Visit (HOSPITAL_COMMUNITY)
Admission: RE | Admit: 2020-08-08 | Discharge: 2020-08-08 | Disposition: A | Discharge status: Home / Self Care | Payer: BC Managed Care – PPO | Source: Ambulatory Visit | Attending: Primary Care | Admitting: Primary Care

## 2020-08-08 ENCOUNTER — Ambulatory Visit (INDEPENDENT_AMBULATORY_CARE_PROVIDER_SITE_OTHER): Payer: BC Managed Care – PPO | Admitting: Primary Care

## 2020-08-08 ENCOUNTER — Encounter: Payer: Self-pay | Admitting: Primary Care

## 2020-08-08 ENCOUNTER — Other Ambulatory Visit: Payer: Self-pay

## 2020-08-08 VITALS — BP 108/60 | HR 58 | Temp 97.8°F | Ht 64.0 in | Wt 150.0 lb

## 2020-08-08 DIAGNOSIS — Z124 Encounter for screening for malignant neoplasm of cervix: Secondary | ICD-10-CM | POA: Insufficient documentation

## 2020-08-08 DIAGNOSIS — Z0000 Encounter for general adult medical examination without abnormal findings: Secondary | ICD-10-CM | POA: Diagnosis not present

## 2020-08-08 DIAGNOSIS — F411 Generalized anxiety disorder: Secondary | ICD-10-CM

## 2020-08-08 DIAGNOSIS — G43829 Menstrual migraine, not intractable, without status migrainosus: Secondary | ICD-10-CM

## 2020-08-08 DIAGNOSIS — Z8619 Personal history of other infectious and parasitic diseases: Secondary | ICD-10-CM | POA: Diagnosis not present

## 2020-08-08 DIAGNOSIS — Z1159 Encounter for screening for other viral diseases: Secondary | ICD-10-CM

## 2020-08-08 DIAGNOSIS — Z114 Encounter for screening for human immunodeficiency virus [HIV]: Secondary | ICD-10-CM | POA: Diagnosis not present

## 2020-08-08 DIAGNOSIS — Z3041 Encounter for surveillance of contraceptive pills: Secondary | ICD-10-CM

## 2020-08-08 LAB — COMPREHENSIVE METABOLIC PANEL
ALT: 16 U/L (ref 0–35)
AST: 21 U/L (ref 0–37)
Albumin: 4.2 g/dL (ref 3.5–5.2)
Alkaline Phosphatase: 62 U/L (ref 39–117)
BUN: 19 mg/dL (ref 6–23)
CO2: 28 mEq/L (ref 19–32)
Calcium: 9.1 mg/dL (ref 8.4–10.5)
Chloride: 102 mEq/L (ref 96–112)
Creatinine, Ser: 1 mg/dL (ref 0.40–1.20)
GFR: 70.08 mL/min (ref >60.00)
Glucose, Bld: 82 mg/dL (ref 70–99)
Potassium: 4.3 mEq/L (ref 3.5–5.1)
Sodium: 137 mEq/L (ref 135–145)
Total Bilirubin: 0.6 mg/dL (ref 0.2–1.2)
Total Protein: 7.4 g/dL (ref 6.0–8.3)

## 2020-08-08 LAB — LIPID PANEL
Cholesterol: 157 mg/dL (ref 0–200)
HDL: 52 mg/dL (ref >39.00)
LDL Cholesterol: 75 mg/dL (ref 0–99)
NonHDL: 105.07
Total CHOL/HDL Ratio: 3
Triglycerides: 150 mg/dL — ABNORMAL HIGH (ref 0.0–149.0)
VLDL: 30 mg/dL (ref 0.0–40.0)

## 2020-08-08 NOTE — Assessment & Plan Note (Signed)
Out breaks occurring 3-4 times annually, resolve immediately with Valtrex 1000 mg PRN. Continue same.

## 2020-08-08 NOTE — Assessment & Plan Note (Signed)
Immunizations UTD. Pap smear due, completed today. Mammogram UTD.  Commended her on regular exercise.  Exam today stable. Labs pending.

## 2020-08-08 NOTE — Assessment & Plan Note (Signed)
Doing well on continuous OCP's for menstrual migraines. Continue same.

## 2020-08-08 NOTE — Assessment & Plan Note (Addendum)
Is now on continuous OCP's for menstrual migraines with improvement. Continue same. Continue sumatriptan 50 mg PRN.

## 2020-08-08 NOTE — Assessment & Plan Note (Signed)
Doing well on Paxil 20 mg. Continue same.

## 2020-08-08 NOTE — Progress Notes (Signed)
Subjective:    Patient ID: Stacey Barnett, female    DOB: February 01, 1980, 40 y.o.   MRN: [id number]  HPI  Stacey Barnett is a very pleasant 40 y.o. female who presents today for complete physical and follow up of chronic conditions.  She is experiencing headaches during the week of her menstrual cycles. She is managed on continuous OCP's for prevention which has helped some, but she still has a dull headache to the frontal or parietal lobe. She's had no migraines since taking OCP's continuously. She will take sumatriptan 50 mg for more severe headaches with resolve.   Immunizations: -Tetanus: 2014 -Influenza: Due this season  -Covid-19: 2 vaccines  Diet: Fair diet.  Exercise: Working out five days weekly, burn boot camp.   Eye exam: No recent visit  Dental exam: Completes semi-annually   Pap Smear: Completed in 2018, due today  Mammogram: Completed in July 2022  BP Readings from Last 3 Encounters:  08/08/20 108/60  04/14/20 106/80  06/25/19 112/70         Review of Systems  Constitutional:  Negative for unexpected weight change.  HENT:  Negative for rhinorrhea.   Respiratory:  Negative for shortness of breath.   Cardiovascular:  Negative for chest pain.  Gastrointestinal:  Negative for constipation and diarrhea.  Genitourinary:  Negative for difficulty urinating.  Musculoskeletal:  Negative for arthralgias and myalgias.  Skin:  Negative for rash.  Allergic/Immunologic: Negative for environmental allergies.  Neurological:  Positive for headaches. Negative for dizziness and numbness.  Psychiatric/Behavioral:  The patient is not nervous/anxious.         Past Medical History:  Diagnosis Date   Anxiety    Herpes labialis    IBS (irritable bowel syndrome)    Migraines    Premature rupture of membranes 05/05/2012   Supervision of other normal pregnancy 11/12/2011   Vaginal delivery 05/06/2012    Social History   Socioeconomic History   Marital status: Married     Spouse name: Stacey Barnett   Number of children: 3   Years of education: 16   Highest education level: Not on file  Occupational History   Occupation: Stay at home mother    Comment: Mother of 3 children   Occupation: Runner, broadcasting/film/video    Comment: Currently not working  Tobacco Use   Smoking status: Never   Smokeless tobacco: Never  Vaping Use   Vaping Use: Never used  Substance and Sexual Activity   Alcohol use: Yes    Comment: Occasionally    Drug use: No   Sexual activity: Yes    Birth control/protection: Pill  Other Topics Concern   Not on file  Social History Narrative   [name] grew up in [place], [place]. She attended [employer] in [place], [place] where she obtained her Bachelors Degree in [employer].    Works teaching second grade.   Enjoys going to the movies, getting pedicures.    Social Determinants of Health   Financial Resource Strain: Not on file  Food Insecurity: Not on file  Transportation Needs: Not on file  Physical Activity: Not on file  Stress: Not on file  Social Connections: Not on file  Intimate Partner Violence: Not on file    Past Surgical History:  Procedure Laterality Date   AUGMENTATION MAMMAPLASTY Bilateral    saline   bunyons     MOLE REMOVAL     WISDOM TOOTH EXTRACTION      Family History  Problem Relation Age of Onset  Stroke Maternal Grandmother    Diabetes Maternal Grandmother    Stroke Paternal Grandfather    Cancer Maternal Grandfather 65       prostate cancer   Heart disease Paternal Grandmother     No Known Allergies  Current Outpatient Medications on File Prior to Visit  Medication Sig Dispense Refill   JUNEL 1/20 1-20 MG-MCG tablet TAKE 1 TABLET BY MOUTH EVERY DAY 63 tablet 3   Multiple Vitamin (MULTIVITAMIN) tablet Take 1 tablet by mouth daily.     mupirocin ointment (BACTROBAN) 2 % Place 1 application into the nose 2 (two) times daily. 22 g 0   PARoxetine (PAXIL) 20 MG tablet TAKE 1 TABLET BY MOUTH EVERY DAY 90  tablet 3   SUMAtriptan (IMITREX) 50 MG tablet TAKE 1 TABLET BY MOUTH AT MIGRAINE ONSET. MAY REPEAT IN 2 HOURS IF HEADACHE PERSISTS OR RECURS. 10 tablet 0   valACYclovir (VALTREX) 1000 MG tablet At the onset of cold sores: take 2 tablets every 12 hours for 1 day. 8 tablet 0   No current facility-administered medications on file prior to visit.    BP 108/60   Pulse (!) 58   Temp 97.8 F (36.6 C) (Temporal)   Ht 5\' 4"  (1.626 m)   Wt 150 lb (68 kg)   LMP 05/05/2020 (Approximate)   SpO2 99%   BMI 25.75 kg/m  Objective:   Physical Exam HENT:     Right Ear: Tympanic membrane and ear canal normal.     Left Ear: Tympanic membrane and ear canal normal.     Nose: Nose normal.  Eyes:     Conjunctiva/sclera: Conjunctivae normal.     Pupils: Pupils are equal, round, and reactive to light.  Neck:     Thyroid: No thyromegaly.  Cardiovascular:     Rate and Rhythm: Normal rate and regular rhythm.     Heart sounds: No murmur heard. Pulmonary:     Effort: Pulmonary effort is normal.     Breath sounds: Normal breath sounds. No rales.  Abdominal:     General: Bowel sounds are normal.     Palpations: Abdomen is soft.     Tenderness: There is no abdominal tenderness.  Genitourinary:    Labia:        Right: No tenderness or lesion.        Left: No tenderness or lesion.      Vagina: Normal.     Cervix: Normal.     Uterus: Normal.      Adnexa: Right adnexa normal and left adnexa normal.  Musculoskeletal:        General: Normal range of motion.     Cervical back: Neck supple.  Lymphadenopathy:     Cervical: No cervical adenopathy.  Skin:    General: Skin is warm and dry.     Findings: No rash.  Neurological:     Mental Status: She is alert and oriented to person, place, and time.     Cranial Nerves: No cranial nerve deficit.     Deep Tendon Reflexes: Reflexes are normal and symmetric.  Psychiatric:        Mood and Affect: Mood normal.          Assessment & Plan:      This  visit occurred during the SARS-CoV-2 public health emergency.  Safety protocols were in place, including screening questions prior to the visit, additional usage of staff PPE, and extensive cleaning of exam room while observing appropriate contact time  as indicated for disinfecting solutions.

## 2020-08-08 NOTE — Patient Instructions (Signed)
Stop by the lab prior to leaving today. I will notify you of your results once received.   It was a pleasure to see you today!  Preventive Care 35-41 Years Old, Female Preventive care refers to lifestyle choices and visits with your health care provider that can promote health and wellness. This includes: A yearly physical exam. This is also called an annual wellness visit. Regular dental and eye exams. Immunizations. Screening for certain conditions. Healthy lifestyle choices, such as: Eating a healthy diet. Getting regular exercise. Not using drugs or products that contain nicotine and tobacco. Limiting alcohol use. What can I expect for my preventive care visit? Physical exam Your health care provider will check your: Height and weight. These may be used to calculate your BMI (body mass index). BMI is a measurement that tells if you are at a healthy weight. Heart rate and blood pressure. Body temperature. Skin for abnormal spots. Counseling Your health care provider may ask you questions about your: Past medical problems. Family's medical history. Alcohol, tobacco, and drug use. Emotional well-being. Home life and relationship well-being. Sexual activity. Diet, exercise, and sleep habits. Work and work Statistician. Access to firearms. Method of birth control. Menstrual cycle. Pregnancy history. What immunizations do I need?  Vaccines are usually given at various ages, according to a schedule. Your health care provider will recommend vaccines for you based on your age, medicalhistory, and lifestyle or other factors, such as travel or where you work. What tests do I need? Blood tests Lipid and cholesterol levels. These may be checked every 5 years, or more often if you are over 30 years old. Hepatitis C test. Hepatitis B test. Screening Lung cancer screening. You may have this screening every year starting at age 41 if you have a 30-pack-year history of smoking and  currently smoke or have quit within the past 15 years. Colorectal cancer screening. All adults should have this screening starting at age 64 and continuing until age 19. Your health care provider may recommend screening at age 73 if you are at increased risk. You will have tests every 1-10 years, depending on your results and the type of screening test. Diabetes screening. This is done by checking your blood sugar (glucose) after you have not eaten for a while (fasting). You may have this done every 1-3 years. Mammogram. This may be done every 1-2 years. Talk with your health care provider about when you should start having regular mammograms. This may depend on whether you have a family history of breast cancer. BRCA-related cancer screening. This may be done if you have a family history of breast, ovarian, tubal, or peritoneal cancers. Pelvic exam and Pap test. This may be done every 3 years starting at age 31. Starting at age 67, this may be done every 5 years if you have a Pap test in combination with an HPV test. Other tests STD (sexually transmitted disease) testing, if you are at risk. Bone density scan. This is done to screen for osteoporosis. You may have this scan if you are at high risk for osteoporosis. Talk with your health care provider about your test results, treatment options,and if necessary, the need for more tests. Follow these instructions at home: Eating and drinking  Eat a diet that includes fresh fruits and vegetables, whole grains, lean protein, and low-fat dairy products. Take vitamin and mineral supplements as recommended by your health care provider. Do not drink alcohol if: Your health care provider tells you not to drink.  You are pregnant, may be pregnant, or are planning to become pregnant. If you drink alcohol: Limit how much you have to 0-1 drink a day. Be aware of how much alcohol is in your drink. In the [place] one drink equals one 12 oz bottle of beer  (355 mL), one 5 oz glass of wine (148 mL), or one 1 oz glass of hard liquor (44 mL).  Lifestyle Take daily care of your teeth and gums. Brush your teeth every morning and night with fluoride toothpaste. Floss one time each day. Stay active. Exercise for at least 30 minutes 5 or more days each week. Do not use any products that contain nicotine or tobacco, such as cigarettes, e-cigarettes, and chewing tobacco. If you need help quitting, ask your health care provider. Do not use drugs. If you are sexually active, practice safe sex. Use a condom or other form of protection to prevent STIs (sexually transmitted infections). If you do not wish to become pregnant, use a form of birth control. If you plan to become pregnant, see your health care provider for a prepregnancy visit. If told by your health care provider, take low-dose aspirin daily starting at age 57. Find healthy ways to cope with stress, such as: Meditation, yoga, or listening to music. Journaling. Talking to a trusted person. Spending time with friends and family. Safety Always wear your seat belt while driving or riding in a vehicle. Do not drive: If you have been drinking alcohol. Do not ride with someone who has been drinking. When you are tired or distracted. While texting. Wear a helmet and other protective equipment during sports activities. If you have firearms in your house, make sure you follow all gun safety procedures. What's next? Visit your health care provider once a year for an annual wellness visit. Ask your health care provider how often you should have your eyes and teeth checked. Stay up to date on all vaccines. This information is not intended to replace advice given to you by your health care provider. Make sure you discuss any questions you have with your healthcare provider. Document Revised: 10/09/2019 Document Reviewed: 09/15/2017 Elsevier Patient Education  2022 [employer].

## 2020-08-12 LAB — HEPATITIS C ANTIBODY
Hepatitis C Ab: NONREACTIVE
SIGNAL TO CUT-OFF: 0.74 (ref <1.00)

## 2020-08-12 LAB — HIV ANTIBODY (ROUTINE TESTING W REFLEX): HIV 1&2 Ab, 4th Generation: NONREACTIVE

## 2020-08-13 LAB — CYTOLOGY - PAP
Adequacy: ABSENT
Comment: NEGATIVE
Diagnosis: NEGATIVE
High risk HPV: NEGATIVE

## 2020-08-29 ENCOUNTER — Other Ambulatory Visit: Payer: Self-pay

## 2020-08-29 DIAGNOSIS — Z8619 Personal history of other infectious and parasitic diseases: Secondary | ICD-10-CM

## 2020-08-29 MED ORDER — VALACYCLOVIR HCL 1 G PO TABS: ORAL_TABLET | ORAL | 0 refills | Status: DC

## 2020-08-29 NOTE — Telephone Encounter (Signed)
Pt left a message on triage VM asking for a refill of valacyclovir to [facility]. She said she has had to use it several times recently due to dental work.

## 2020-08-31 ENCOUNTER — Other Ambulatory Visit: Payer: Self-pay | Admitting: Primary Care

## 2020-08-31 DIAGNOSIS — G43829 Menstrual migraine, not intractable, without status migrainosus: Secondary | ICD-10-CM

## 2020-09-01 NOTE — Telephone Encounter (Signed)
Error last refill called in last month verified with patient that was sent on auto refill not needed at this time. Refill denied.

## 2020-09-01 NOTE — Telephone Encounter (Signed)
CPE 7/22 per note continue medications. Refill called in.

## 2020-10-16 ENCOUNTER — Other Ambulatory Visit: Payer: Self-pay | Admitting: Primary Care

## 2020-10-16 DIAGNOSIS — Z3041 Encounter for surveillance of contraceptive pills: Secondary | ICD-10-CM

## 2020-10-28 ENCOUNTER — Telehealth: Payer: Self-pay

## 2020-10-28 NOTE — Telephone Encounter (Signed)
Pt calling to see about changing her BC to something milder. She is currently seeing a dermatologist for melasma.

## 2020-10-29 NOTE — Telephone Encounter (Signed)
Pt calling; understands ABC's msg; is willing to try bc to see if it helps with cycle, H/As and face; [facility]?  [phone]

## 2020-10-29 NOTE — Telephone Encounter (Signed)
LM for pt. On cont dosing OCPs for menstrual migraines. Can try POPs but may affect migraine control.

## 2020-10-30 MED ORDER — NORETHINDRONE 0.35 MG PO TABS: 1.0000 | ORAL_TABLET | Freq: Every day | ORAL | 1 refills | Status: DC

## 2020-10-30 NOTE — Telephone Encounter (Signed)
Pt aware.

## 2020-10-30 NOTE — Telephone Encounter (Signed)
Stacey Barnett. Pt to complete current pill pack and then start these. There are no placebo pills, so period will come when it wants. Pt to see how headaches do with these pills. F/u prn

## 2020-11-19 ENCOUNTER — Other Ambulatory Visit: Payer: Self-pay

## 2020-11-19 ENCOUNTER — Telehealth (INDEPENDENT_AMBULATORY_CARE_PROVIDER_SITE_OTHER): Payer: BC Managed Care – PPO | Admitting: Family Medicine

## 2020-11-19 ENCOUNTER — Encounter: Payer: Self-pay | Admitting: Family Medicine

## 2020-11-19 VITALS — Ht 64.0 in

## 2020-11-19 DIAGNOSIS — R0981 Nasal congestion: Secondary | ICD-10-CM

## 2020-11-19 DIAGNOSIS — R058 Other specified cough: Secondary | ICD-10-CM

## 2020-11-19 MED ORDER — HYDROCOD POLST-CPM POLST ER 10-8 MG/5ML PO SUER: 5.0000 mL | Freq: Every evening | ORAL | 0 refills | Status: DC | PRN

## 2020-11-19 NOTE — Progress Notes (Signed)
      Jalayne Ganesh T. Yarisa Lynam, MD Primary Care and Sports Medicine [facility] at [facility] [address] [place], [address] Phone: [phone]  FAX: [phone]  Stacey Barnett - 40 y.o. female  MRN [id number]  Date of Birth: 09-11-1980  Visit Date: 11/19/2020  PCP: [name], NP  Referred by: [name], NP  Virtual Visit via Video Note:  I connected with  Stacey Barnett on 11/19/2020  3:20 PM EDT by a video enabled telemedicine application and verified that I am speaking with the correct person using two identifiers.   Location patient: home computer, tablet, or smartphone Location provider: work or home office Consent: Verbal consent directly obtained from [employer]. Persons participating in the virtual visit: patient, provider  I discussed the limitations of evaluation and management by telemedicine and the availability of in person appointments. The patient expressed understanding and agreed to proceed.  Chief Complaint  Patient presents with   Cough    With yellowish phelgm-Worse at night-Asking for Tussionex refill.  Has old prescription she has been using.   Dizziness   Headache   Sinus Drainage    History of Present Illness:  Cough and cold for a couple of weeks.  Has been taking some tussionex.  Works well for her.  Not much of a stuffy nose and pushed through.  Teaches second graders.  Does not feel all that bad.  Feels dizzy sometimes with not doing much.  Did not take a covid test  Review of Systems as above: See pertinent positives and pertinent negatives per HPI No acute distress verbally   Observations/Objective/Exam:  An attempt was made to discern vital signs over the phone and per patient if applicable and possible.   General:    Alert, Oriented, appears well and in no acute distress  Pulmonary:     On inspection no signs of respiratory distress.  Psych / Neurological:     Pleasant and  cooperative.  Assessment and Plan:    ICD-10-CM   1. Post-viral cough syndrome  R05.8 chlorpheniramine-HYDROcodone (TUSSIONEX PENNKINETIC ER) 10-8 MG/5ML SUER    2. Nasal congestion  R09.81      At this point the patient is about 2 weeks out from symptom onset.  Difficult to know exactly what this thing was at the beginning, possible viral syndrome, possibly influenza, cannot exclude COVID-19.  Nevertheless, she does have a lingering cough and I do think is probably reasonable to refill her Tussionex.  I discussed the assessment and treatment plan with the patient. The patient was provided an opportunity to ask questions and all were answered. The patient agreed with the plan and demonstrated an understanding of the instructions.   The patient was advised to call back or seek an in-person evaluation if the symptoms worsen or if the condition fails to improve as anticipated.  Follow-up: prn unless noted otherwise below No follow-ups on file.  Meds ordered this encounter  Medications   chlorpheniramine-HYDROcodone (TUSSIONEX PENNKINETIC ER) 10-8 MG/5ML SUER    Sig: Take 5 mLs by mouth at bedtime as needed for cough.    Dispense:  140 mL    Refill:  0    Refill of prior script from 1 year ago    No orders of the defined types were placed in this encounter.   Signed,  Elpidio Galea. Arti Trang, MD

## 2021-01-09 ENCOUNTER — Other Ambulatory Visit: Payer: Self-pay | Admitting: Family Medicine

## 2021-01-09 DIAGNOSIS — F411 Generalized anxiety disorder: Secondary | ICD-10-CM

## 2021-02-04 ENCOUNTER — Telehealth: Payer: Self-pay

## 2021-02-04 NOTE — Telephone Encounter (Signed)
Pt calling; was put on a new bc a few months ago; everything is fine except for she feels bloated all the time;  Is there a laxative we recommend?  [phone]

## 2021-02-06 NOTE — Telephone Encounter (Signed)
Called pt, no answer, LVMTRC. 

## 2021-02-06 NOTE — Telephone Encounter (Signed)
Is she constipated and having bloating or having regular BM with bloating? If constipated, add fiber supp with lots of water.

## 2021-02-09 NOTE — Telephone Encounter (Signed)
Pt returning Grecia's call.  [phone]

## 2021-02-09 NOTE — Telephone Encounter (Signed)
Response sent to pt via MyChart

## 2021-04-04 ENCOUNTER — Other Ambulatory Visit: Payer: Self-pay | Admitting: Obstetrics and Gynecology

## 2021-04-06 ENCOUNTER — Telehealth: Payer: Self-pay

## 2021-04-06 NOTE — Telephone Encounter (Signed)
Pt calling for refill of her bcp. It has been denied.  [phone] ?

## 2021-04-07 NOTE — Telephone Encounter (Signed)
Called and left voicemail for patient to call back to be scheduled. 

## 2021-04-15 ENCOUNTER — Telehealth: Payer: Self-pay | Admitting: Family Medicine

## 2021-04-15 MED ORDER — NORETHINDRONE 0.35 MG PO TABS: 1.0000 | ORAL_TABLET | Freq: Every day | ORAL | 0 refills | Status: DC

## 2021-04-15 NOTE — Telephone Encounter (Signed)
Pt is scheduled for her annual with Dr. Alvester Morin on 4/11.   ?

## 2021-04-28 ENCOUNTER — Ambulatory Visit (INDEPENDENT_AMBULATORY_CARE_PROVIDER_SITE_OTHER): Payer: BC Managed Care – PPO | Admitting: Family Medicine

## 2021-04-28 ENCOUNTER — Encounter: Payer: Self-pay | Admitting: Family Medicine

## 2021-04-28 VITALS — BP 122/72 | Ht 64.0 in | Wt 156.0 lb

## 2021-04-28 DIAGNOSIS — Z8619 Personal history of other infectious and parasitic diseases: Secondary | ICD-10-CM

## 2021-04-28 DIAGNOSIS — G43829 Menstrual migraine, not intractable, without status migrainosus: Secondary | ICD-10-CM | POA: Diagnosis not present

## 2021-04-28 DIAGNOSIS — K5900 Constipation, unspecified: Secondary | ICD-10-CM | POA: Diagnosis not present

## 2021-04-28 MED ORDER — NORETHINDRONE 0.35 MG PO TABS: 1.0000 | ORAL_TABLET | Freq: Every day | ORAL | 4 refills | Status: DC

## 2021-04-28 MED ORDER — VALACYCLOVIR HCL 1 G PO TABS: ORAL_TABLET | ORAL | 0 refills | Status: DC

## 2021-04-28 NOTE — Progress Notes (Signed)
? ?GYNECOLOGY ANNUAL PREVENTATIVE CARE ENCOUNTER NOTE ? ?Subjective:  ? Stacey Barnett is a 41 y.o. G10P3003 female here for a routine annual gynecologic exam.  Current complaints: none, HA are improved on continuous POP dosing.  Reports constipation and wants instruction for clean our. Tried ducolax but only had cramping and abdominal pain. Reported tingling in hands, present since childhood but worsening this past year.    ? ?Menses are regular periods every 28-30 days ? ?Denies abnormal vaginal bleeding, discharge, pelvic pain, problems with intercourse or other gynecologic concerns.  ?  ?Gynecologic History ?No LMP recorded. (Menstrual status: Oral contraceptives). ?Contraception: oral progesterone-only contraceptive ?Last Pap: July 2022. Results were: normal ?Last mammogram: 2022. Results were: normal ? ?There are no preventive care reminders to display for this patient. ? ?The following portions of the patient's history were reviewed and updated as appropriate: allergies, current medications, past family history, past medical history, past social history, past surgical history and problem list. ? ?Review of Systems ?Pertinent items are noted in HPI. ?  ?Objective:  ?BP 122/72   Ht 5\' 4"  (1.626 m)   Wt 156 lb (70.8 kg)   BMI 26.78 kg/m?  ?CONSTITUTIONAL: Well-developed, well-nourished female in no acute distress.  ?HENT:  Normocephalic, atraumatic, External right and left ear normal. Oropharynx is clear and moist ?EYES:  No scleral icterus.  ?NECK: Normal range of motion, supple, no masses.  Normal thyroid.  ?SKIN: Skin is warm and dry. No rash noted. Not diaphoretic. No erythema. No pallor. ?NEUROLOGIC: Alert and oriented to person, place, and time. Normal reflexes, muscle tone coordination. No cranial nerve deficit noted. ?PSYCHIATRIC: Normal mood and affect. Normal behavior. Normal judgment and thought content. ?CARDIOVASCULAR: Normal heart rate noted, regular rhythm. 2+ distal pulses. ?RESPIRATORY:  Effort and breath sounds normal, no problems with respiration noted. ?BREASTS: Implants bilaterally with inferior mammary gland. Appear to be above muscle. Symmetric in size. No masses, skin changes, nipple drainage, or lymphadenopathy. ?ABDOMEN: Soft,  no distention noted.  No tenderness, rebound or guarding.  ?PELVIC: Normal appearing external genitalia; normal appearing vaginal mucosa and cervix.  No abnormal discharge noted.   Normal uterine size, no other palpable masses, no uterine or adnexal tenderness. ?MUSCULOSKELETAL: Normal range of motion.  ? ?Assessment and Plan:  ?1) Annual gynecologic examination without pap smear ( ) Will follow up results of pap smear and manage accordingly. STI screening desired No.  Routine preventative health maintenance measures emphasized. Reviewed perimenopausal symptoms and management.  ?- mammogram in 2022 and is up to date ?- Pap in 2022, next in 3-5 years ? ?2) Contraception counseling: Patient desires POP, this was prescribed for patient. She will follow up in  1 year for surveillance.  She was told to call with any further questions, or with any concerns about this method of contraception.  Emphasized use of condoms 100% of the time for STI prevention. ? ?1. H/O cold sores 2023) ?Get sores with facials etc. Sent in medication for occasional treatment. Does not meet criteria for suppression dosing. Located on upper lip ?- valACYclovir (VALTREX) 1000 MG tablet; At the onset of cold sores: take 2 tablets every 12 hours for 1 day.  Dispense: 8 tablet; Refill: 0 ? ?2. Menstrual migraine without status migrainosus, not intractable ?Reviewed treatment for menstrual HA ?- norethindrone (MICRONOR) 0.35 MG tablet; Take 1 tablet (0.35 mg total) by mouth daily.  Dispense: 84 tablet; Refill: 4 ? ?3. Constipation (68341) ?- given miralax clean out instructions in AVS ?- Recommended continued therapy  for at least 6 months  ? ?There are no preventive care reminders to display for  this patient. ? ? ?Please refer to After Visit Summary for other counseling recommendations.  ? ?Return in about 1 year (around 04/29/2022) for Yearly wellness exam. ? ?Federico Flake, MD, MPH, ABFM ?Attending Physician ?[facility] ? ?

## 2021-04-28 NOTE — Patient Instructions (Addendum)
You have constipation which is hard stools that are difficult to pass. It is important to have regular bowel movements every 1-3 days that are soft and easy to pass. Hard stools increase your risk of hemorrhoids and are very uncomfortable.  ? ?To prevent constipation you can increase the amount of fiber in your diet. Examples of foods with fiber are leafy greens, whole grain breads, oatmeal and other grains.  It is also important to drink at least eight 8oz glass of water everyday.  ? ?If you have not has a bowel movement in 4-5 days you made need to clean out your bowel.  This will have establish normal movement through your bowel.   ? ?Miralax Clean out ?Take 8 capfuls of miralax in 64 oz of gatorade. You can use any fluid that appeals to you (gatorade, water, juice) ?Continue to drink at least eight 8 oz glasses of water throughout the day ?You can repeat with another 8 capfuls of miralax in 64 oz of gatorade if you are not having a large amount of stools ?You will need to be at home and close to a bathroom for about 8 hours when you do the above as you may need to go to the bathroom frequently.  ? ?After you are cleaned out: ?- Start Colace100mg  twice daily ?- Start Miralax once daily ?- Start a daily fiber supplement like metamucil or citrucel ?- Recommend starting a probiotic ?- Recommend water intake of 64 oz daily ?- Eat lots of leafy greens/fiber rich fruits.  ?- if you are having diarrhea you can reduce to Colace once a day or miralax every other day or a 1/2 capful daily.  ? ?

## 2021-06-30 ENCOUNTER — Other Ambulatory Visit: Payer: Self-pay | Admitting: Primary Care

## 2021-06-30 DIAGNOSIS — F411 Generalized anxiety disorder: Secondary | ICD-10-CM

## 2021-06-30 NOTE — Telephone Encounter (Signed)
Patient needs office visit in late July

## 2021-07-02 NOTE — Telephone Encounter (Signed)
Appointment made with patient no further action needed.

## 2021-08-11 ENCOUNTER — Encounter: Payer: Self-pay | Admitting: Primary Care

## 2021-08-11 ENCOUNTER — Ambulatory Visit (INDEPENDENT_AMBULATORY_CARE_PROVIDER_SITE_OTHER): Payer: BC Managed Care – PPO | Admitting: Primary Care

## 2021-08-11 ENCOUNTER — Other Ambulatory Visit: Payer: Self-pay | Admitting: Primary Care

## 2021-08-11 VITALS — BP 116/78 | HR 60 | Temp 98.7°F | Ht 64.0 in | Wt 156.0 lb

## 2021-08-11 DIAGNOSIS — G8929 Other chronic pain: Secondary | ICD-10-CM

## 2021-08-11 DIAGNOSIS — Z3041 Encounter for surveillance of contraceptive pills: Secondary | ICD-10-CM

## 2021-08-11 DIAGNOSIS — Z0001 Encounter for general adult medical examination with abnormal findings: Secondary | ICD-10-CM

## 2021-08-11 DIAGNOSIS — F411 Generalized anxiety disorder: Secondary | ICD-10-CM

## 2021-08-11 DIAGNOSIS — R202 Paresthesia of skin: Secondary | ICD-10-CM

## 2021-08-11 DIAGNOSIS — K589 Irritable bowel syndrome without diarrhea: Secondary | ICD-10-CM

## 2021-08-11 DIAGNOSIS — M79602 Pain in left arm: Secondary | ICD-10-CM

## 2021-08-11 DIAGNOSIS — J3489 Other specified disorders of nose and nasal sinuses: Secondary | ICD-10-CM

## 2021-08-11 DIAGNOSIS — Z8619 Personal history of other infectious and parasitic diseases: Secondary | ICD-10-CM | POA: Diagnosis not present

## 2021-08-11 DIAGNOSIS — G43829 Menstrual migraine, not intractable, without status migrainosus: Secondary | ICD-10-CM | POA: Diagnosis not present

## 2021-08-11 DIAGNOSIS — Z1231 Encounter for screening mammogram for malignant neoplasm of breast: Secondary | ICD-10-CM

## 2021-08-11 HISTORY — DX: Other chronic pain: G89.29

## 2021-08-11 LAB — CBC
HCT: 40.3 % (ref 36.0–46.0)
Hemoglobin: 13.9 g/dL (ref 12.0–15.0)
MCHC: 34.6 g/dL (ref 30.0–36.0)
MCV: 90.2 fl (ref 78.0–100.0)
Platelets: 202 10*3/uL (ref 150.0–400.0)
RBC: 4.47 Mil/uL (ref 3.87–5.11)
RDW: 12.9 % (ref 11.5–15.5)
WBC: 4.3 10*3/uL (ref 4.0–10.5)

## 2021-08-11 LAB — COMPREHENSIVE METABOLIC PANEL
ALT: 11 U/L (ref 0–35)
AST: 16 U/L (ref 0–37)
Albumin: 4.4 g/dL (ref 3.5–5.2)
Alkaline Phosphatase: 72 U/L (ref 39–117)
BUN: 19 mg/dL (ref 6–23)
CO2: 27 mEq/L (ref 19–32)
Calcium: 9 mg/dL (ref 8.4–10.5)
Chloride: 104 mEq/L (ref 96–112)
Creatinine, Ser: 0.8 mg/dL (ref 0.40–1.20)
GFR: 90.95 mL/min (ref >60.00)
Glucose, Bld: 78 mg/dL (ref 70–99)
Potassium: 4.2 mEq/L (ref 3.5–5.1)
Sodium: 138 mEq/L (ref 135–145)
Total Bilirubin: 0.8 mg/dL (ref 0.2–1.2)
Total Protein: 7.4 g/dL (ref 6.0–8.3)

## 2021-08-11 LAB — LIPID PANEL
Cholesterol: 139 mg/dL (ref 0–200)
HDL: 53.8 mg/dL (ref >39.00)
LDL Cholesterol: 75 mg/dL (ref 0–99)
NonHDL: 85.34
Total CHOL/HDL Ratio: 3
Triglycerides: 53 mg/dL (ref 0.0–149.0)
VLDL: 10.6 mg/dL (ref 0.0–40.0)

## 2021-08-11 NOTE — Assessment & Plan Note (Signed)
Controlled.  Continue Valtrex 1000 mg PRN.

## 2021-08-11 NOTE — Assessment & Plan Note (Signed)
Controlled.  Continue Micronor 0.35 mg daily.  She takes continuously.

## 2021-08-11 NOTE — Assessment & Plan Note (Signed)
Immunizations UTD. Pap smear UTD Mammogram due and pending.  Discussed the importance of a healthy diet and regular exercise in order for weight loss, and to reduce the risk of further co-morbidity.  Exam stable. Labs pending.  Follow up in 1 year for repeat physical.

## 2021-08-11 NOTE — Assessment & Plan Note (Signed)
MSK with nerve involvement. Exam today grossly negative but she does have symptoms of preanesthesias with movement.  Offered course of oral steroids, she kindly declines. Referral placed to PT.

## 2021-08-11 NOTE — Assessment & Plan Note (Signed)
Controlled.  Continue paroxetine 20 mg daily. Continue to monitor.

## 2021-08-11 NOTE — Assessment & Plan Note (Signed)
Improved and under better control!  Continue Micronor 0.35 mg continuously. Continue sumatriptan 50 mg PRN.

## 2021-08-11 NOTE — Progress Notes (Signed)
Subjective:    Patient ID: Stacey Barnett, female    DOB: [birth year], 41 y.o.   MRN: [id number]  HPI  Stacey Barnett is a very pleasant 41 y.o. female who presents today for complete physical and follow up of chronic conditions.  She would also like to discuss left upper extremity pain. Chronic since February/[place] 2023 after working out at the gym. Symptoms include pain to the left shoulder and lateral upper arm with numbness from the left shoulder down to her left fingertips. Overall she's noticed improvement, but continues to notice symptoms. She's not taken anything OTC for symptoms. She denies weakness, neck pain.   Immunizations: -Tetanus: 2014 -Influenza: Completed last season -Covid-19: 2 vaccines  Diet: Fair diet.  Exercise: No regular exercise.  Eye exam: Completed years ago.   Dental exam: Completes semi-annually   Pap Smear: Completed in 2022 Mammogram: Completed in July 2022, due.  BP Readings from Last 3 Encounters:  08/11/21 116/78  04/28/21 122/72  08/08/20 108/60    Wt Readings from Last 3 Encounters:  08/11/21 156 lb (70.8 kg)  04/28/21 156 lb (70.8 kg)  08/08/20 150 lb (68 kg)         Review of Systems  Constitutional:  Negative for unexpected weight change.  HENT:  Negative for rhinorrhea.   Respiratory:  Negative for cough and shortness of breath.   Cardiovascular:  Negative for chest pain.  Gastrointestinal:  Negative for constipation and diarrhea.  Genitourinary:  Negative for difficulty urinating and menstrual problem.  Musculoskeletal:  Positive for arthralgias. Negative for myalgias.  Skin:  Negative for rash.  Allergic/Immunologic: Negative for environmental allergies.  Neurological:  Positive for numbness. Negative for dizziness and headaches.  Psychiatric/Behavioral:  The patient is not nervous/anxious.          Past Medical History:  Diagnosis Date   Anxiety    Herpes labialis    IBS (irritable bowel syndrome)    Migraines     Premature rupture of membranes 05/05/2012   Supervision of other normal pregnancy 11/12/2011   Vaginal delivery 05/06/2012    Social History   Socioeconomic History   Marital status: Married    Spouse name: Josh   Number of children: 3   Years of education: 16   Highest education level: Not on file  Occupational History   Occupation: Stay at home mother    Comment: Mother of 3 children   Occupation: Runner, broadcasting/film/video    Comment: Currently not working  Tobacco Use   Smoking status: Never   Smokeless tobacco: Never  Vaping Use   Vaping Use: Never used  Substance and Sexual Activity   Alcohol use: Yes    Comment: Occasionally    Drug use: No   Sexual activity: Yes    Birth control/protection: Pill  Other Topics Concern   Not on file  Social History Narrative   Stacey Barnett grew up in [place], [place]. She attended [employer] in [place], [place] where she obtained her Bachelors Degree in [employer].    Works teaching second grade.   Enjoys going to the movies, getting pedicures.    Social Determinants of Health   Financial Resource Strain: Not on file  Food Insecurity: Not on file  Transportation Needs: Not on file  Physical Activity: Not on file  Stress: Not on file  Social Connections: Not on file  Intimate Partner Violence: Not on file    Past Surgical History:  Procedure Laterality Date   AUGMENTATION MAMMAPLASTY Bilateral  saline   bunyons     MOLE REMOVAL     WISDOM TOOTH EXTRACTION      Family History  Problem Relation Age of Onset   Stroke Maternal Grandmother    Diabetes Maternal Grandmother    Stroke Paternal Grandfather    Cancer Maternal Grandfather 3       prostate cancer   Heart disease Paternal Grandmother     No Known Allergies  Current Outpatient Medications on File Prior to Visit  Medication Sig Dispense Refill   Multiple Vitamin (MULTIVITAMIN) tablet Take 1 tablet by mouth daily.     mupirocin ointment (BACTROBAN) 2 % Place 1  application into the nose 2 (two) times daily. 22 g 0   norethindrone (MICRONOR) 0.35 MG tablet Take 1 tablet (0.35 mg total) by mouth daily. 84 tablet 4   PARoxetine (PAXIL) 20 MG tablet Take 1 tablet (20 mg total) by mouth daily. For anxiety. Office visit required for further refills. 60 tablet 0   SUMAtriptan (IMITREX) 50 MG tablet TAKE 1 TABLET BY MOUTH AT MIGRAINE ONSET. MAY REPEAT IN 2 HOURS IF HEADACHE PERSISTS OR RECURS. 10 tablet 0   valACYclovir (VALTREX) 1000 MG tablet At the onset of cold sores: take 2 tablets every 12 hours for 1 day. 8 tablet 0   No current facility-administered medications on file prior to visit.    BP 116/78   Pulse 60   Temp 98.7 F (37.1 C) (Oral)   Ht 5\' 4"  (1.626 m)   Wt 156 lb (70.8 kg)   LMP 08/07/2021   SpO2 98%   BMI 26.78 kg/m  Objective:   Physical Exam HENT:     Right Ear: Tympanic membrane and ear canal normal.     Left Ear: Tympanic membrane and ear canal normal.     Nose: Nose normal.  Eyes:     Conjunctiva/sclera: Conjunctivae normal.     Pupils: Pupils are equal, round, and reactive to light.  Neck:     Thyroid: No thyromegaly.  Cardiovascular:     Rate and Rhythm: Normal rate and regular rhythm.     Heart sounds: No murmur heard. Pulmonary:     Effort: Pulmonary effort is normal.     Breath sounds: Normal breath sounds. No rales.  Abdominal:     General: Bowel sounds are normal.     Palpations: Abdomen is soft.     Tenderness: There is no abdominal tenderness.  Musculoskeletal:        General: Normal range of motion.     Right shoulder: Normal. Normal range of motion. Normal strength.     Left shoulder: Normal. Normal range of motion. Normal strength.     Cervical back: Neck supple.  Lymphadenopathy:     Cervical: No cervical adenopathy.  Skin:    General: Skin is warm and dry.     Findings: No rash.  Neurological:     Mental Status: She is alert and oriented to person, place, and time.     Cranial Nerves: No  cranial nerve deficit.     Deep Tendon Reflexes: Reflexes are normal and symmetric.           Assessment & Plan:   Problem List Items Addressed This Visit       Cardiovascular and Mediastinum   Menstrual headache    Improved and under better control!  Continue Micronor 0.35 mg continuously. Continue sumatriptan 50 mg PRN.         Respiratory  Nasal vestibulitis    Controlled.  Continue mupirocin 2% ointment PRN.         Digestive   IBS (irritable bowel syndrome)    Overall stable. No concerns today.  Continue to monitor.         Other   Generalized anxiety disorder    Controlled.  Continue paroxetine 20 mg daily. Continue to monitor.       H/O cold sores    Controlled.  Continue Valtrex 1000 mg PRN.      Encounter for surveillance of contraceptive pills    Controlled.  Continue Micronor 0.35 mg daily.  She takes continuously.       Encounter for annual general medical examination with abnormal findings in adult - Primary    Immunizations UTD. Pap smear UTD Mammogram due and pending.  Discussed the importance of a healthy diet and regular exercise in order for weight loss, and to reduce the risk of further co-morbidity.  Exam stable. Labs pending.  Follow up in 1 year for repeat physical.       Relevant Orders   Lipid panel   CBC   Comprehensive metabolic panel   Chronic pain of left upper extremity    MSK with nerve involvement. Exam today grossly negative but she does have symptoms of preanesthesias with movement.  Offered course of oral steroids, she kindly declines. Referral placed to PT.        Relevant Orders   Ambulatory referral to Physical Therapy   Other Visit Diagnoses     Encounter for screening mammogram for malignant neoplasm of breast       Relevant Orders   MM 3D SCREEN BREAST BILATERAL   Paresthesias       Relevant Orders   Ambulatory referral to Physical Therapy          [name],  NP

## 2021-08-11 NOTE — Patient Instructions (Signed)
Stop by the lab prior to leaving today. I will notify you of your results once received.   Call [facility] to schedule your mammogram.   You will be contacted regarding your referral to physical therapy.  Please let us know if you have not been contacted within two weeks.   It was a pleasure to see you today!   Preventive Care 42-42 Years Old, Female Preventive care refers to lifestyle choices and visits with your health care provider that can promote health and wellness. Preventive care visits are also called wellness exams. What can I expect for my preventive care visit? Counseling Your health care provider may ask you questions about your: Medical history, including: Past medical problems. Family medical history. Pregnancy history. Current health, including: Menstrual cycle. Method of birth control. Emotional well-being. Home life and relationship well-being. Sexual activity and sexual health. Lifestyle, including: Alcohol, nicotine or tobacco, and drug use. Access to firearms. Diet, exercise, and sleep habits. Work and work Statistician. Sunscreen use. Safety issues such as seatbelt and bike helmet use. Physical exam Your health care provider will check your: Height and weight. These may be used to calculate your BMI (body mass index). BMI is a measurement that tells if you are at a healthy weight. Waist circumference. This measures the distance around your waistline. This measurement also tells if you are at a healthy weight and may help predict your risk of certain diseases, such as type 2 diabetes and high blood pressure. Heart rate and blood pressure. Body temperature. Skin for abnormal spots. What immunizations do I need?  Vaccines are usually given at various ages, according to a schedule. Your health care provider will recommend vaccines for you based on your age, medical history, and lifestyle or other factors, such as travel or where you work. What tests do  I need? Screening Your health care provider may recommend screening tests for certain conditions. This may include: Lipid and cholesterol levels. Diabetes screening. This is done by checking your blood sugar (glucose) after you have not eaten for a while (fasting). Pelvic exam and Pap test. Hepatitis B test. Hepatitis C test. HIV (human immunodeficiency virus) test. STI (sexually transmitted infection) testing, if you are at risk. Lung cancer screening. Colorectal cancer screening. Mammogram. Talk with your health care provider about when you should start having regular mammograms. This may depend on whether you have a family history of breast cancer. BRCA-related cancer screening. This may be done if you have a family history of breast, ovarian, tubal, or peritoneal cancers. Bone density scan. This is done to screen for osteoporosis. Talk with your health care provider about your test results, treatment options, and if necessary, the need for more tests. Follow these instructions at home: Eating and drinking  Eat a diet that includes fresh fruits and vegetables, whole grains, lean protein, and low-fat dairy products. Take vitamin and mineral supplements as recommended by your health care provider. Do not drink alcohol if: Your health care provider tells you not to drink. You are pregnant, may be pregnant, or are planning to become pregnant. If you drink alcohol: Limit how much you have to 0-1 drink a day. Know how much alcohol is in your drink. In the [place] one drink equals one 12 oz bottle of beer (355 mL), one 5 oz glass of wine (148 mL), or one 1 oz glass of hard liquor (44 mL). Lifestyle Brush your teeth every morning and night with fluoride toothpaste. Floss one time each day.  Exercise for at least 30 minutes 5 or more days each week. Do not use any products that contain nicotine or tobacco. These products include cigarettes, chewing tobacco, and vaping devices, such as  e-cigarettes. If you need help quitting, ask your health care provider. Do not use drugs. If you are sexually active, practice safe sex. Use a condom or other form of protection to prevent STIs. If you do not wish to become pregnant, use a form of birth control. If you plan to become pregnant, see your health care provider for a prepregnancy visit. Take aspirin only as told by your health care provider. Make sure that you understand how much to take and what form to take. Work with your health care provider to find out whether it is safe and beneficial for you to take aspirin daily. Find healthy ways to manage stress, such as: Meditation, yoga, or listening to music. Journaling. Talking to a trusted person. Spending time with friends and family. Minimize exposure to UV radiation to reduce your risk of skin cancer. Safety Always wear your seat belt while driving or riding in a vehicle. Do not drive: If you have been drinking alcohol. Do not ride with someone who has been drinking. When you are tired or distracted. While texting. If you have been using any mind-altering substances or drugs. Wear a helmet and other protective equipment during sports activities. If you have firearms in your house, make sure you follow all gun safety procedures. Seek help if you have been physically or sexually abused. What's next? Visit your health care provider once a year for an annual wellness visit. Ask your health care provider how often you should have your eyes and teeth checked. Stay up to date on all vaccines. This information is not intended to replace advice given to you by your health care provider. Make sure you discuss any questions you have with your health care provider. Document Revised: 07/02/2020 Document Reviewed: 07/02/2020 Elsevier Patient Education  [place].

## 2021-08-11 NOTE — Assessment & Plan Note (Signed)
Controlled.  Continue mupirocin 2% ointment PRN.

## 2021-08-11 NOTE — Assessment & Plan Note (Signed)
Overall stable.  No concerns today.  Continue to monitor.  

## 2021-08-13 ENCOUNTER — Ambulatory Visit
Admission: RE | Admit: 2021-08-13 | Discharge: 2021-08-13 | Disposition: A | Discharge status: Home / Self Care | Payer: BC Managed Care – PPO | Source: Ambulatory Visit | Attending: Primary Care | Admitting: Primary Care

## 2021-08-13 DIAGNOSIS — Z1231 Encounter for screening mammogram for malignant neoplasm of breast: Secondary | ICD-10-CM | POA: Diagnosis not present

## 2021-08-26 ENCOUNTER — Other Ambulatory Visit: Payer: Self-pay | Admitting: Primary Care

## 2021-08-26 DIAGNOSIS — F411 Generalized anxiety disorder: Secondary | ICD-10-CM

## 2021-08-28 ENCOUNTER — Telehealth: Payer: Self-pay

## 2021-08-28 NOTE — Telephone Encounter (Signed)
Patient contacted office requesting for lab order for a hormone panel? Patient states in message that she has a condition called Melasma and states that it is on her face, patient states that she recently had labs drawn by PCP on 08/11/21 and noticed that her hormone levels were not checked. Patient is requesting lab order be placed. Patient was las seen in our office on 04/28/21 by Dr. Alvester Morin. Please review chart and advise. KW

## 2021-08-31 NOTE — Telephone Encounter (Signed)
Melasma can be caused by birth control pills. No hormone testing needed (and not accurate when on birth control). Can change to hormone free BC like copper IUD. See derm for treatment options. No appt needed with GYN if wants to continue BCPs.

## 2021-08-31 NOTE — Telephone Encounter (Signed)
Can you please review when you return back to office? KW

## 2021-08-31 NOTE — Telephone Encounter (Signed)
Can someone please help schedule this pt with [facility] (per Annie's note) .

## 2021-09-01 ENCOUNTER — Telehealth: Payer: Self-pay | Admitting: Obstetrics and Gynecology

## 2021-09-01 NOTE — Telephone Encounter (Signed)
See previous telephone encounter. KW 

## 2021-09-01 NOTE — Telephone Encounter (Signed)
LMTCB, if patient returns call please advise of providers message below. KW

## 2021-09-01 NOTE — Telephone Encounter (Signed)
Pt called in because haven't anyone gotten back with her. Reached out to [place] and she said that you were the last to try and get the pt. If you can you please call the pt back. Thanks

## 2021-09-11 ENCOUNTER — Telehealth (INDEPENDENT_AMBULATORY_CARE_PROVIDER_SITE_OTHER): Payer: BC Managed Care – PPO | Admitting: Family Medicine

## 2021-09-11 ENCOUNTER — Ambulatory Visit: Payer: BC Managed Care – PPO | Admitting: Family Medicine

## 2021-09-11 VITALS — Ht 64.0 in | Wt 155.0 lb

## 2021-09-11 DIAGNOSIS — R059 Cough, unspecified: Secondary | ICD-10-CM | POA: Diagnosis not present

## 2021-09-11 MED ORDER — PROMETHAZINE-DM 6.25-15 MG/5ML PO SYRP: 5.0000 mL | ORAL_SOLUTION | Freq: Four times a day (QID) | ORAL | 0 refills | Status: DC | PRN

## 2021-09-11 MED ORDER — AZITHROMYCIN 250 MG PO TABS: ORAL_TABLET | ORAL | 0 refills | Status: DC

## 2021-09-11 MED ORDER — AZELASTINE HCL 0.1 % NA SOLN: 2.0000 | Freq: Two times a day (BID) | NASAL | 12 refills | Status: DC

## 2021-09-11 NOTE — Progress Notes (Signed)
   Stacey Barnett is a 40 y.o. female who presents today for a virtual office visit.  Assessment/Plan:  Cough No red flags.  We discussed limitations of virtual visit and inability to perform physical exam.  We will treat with Astelin and promethazine-dextromethorphan cough syrup.  We will also send in pocket prescription for azithromycin with instruction to not start start unless symptoms do not improve over the next several days.  She will continue over-the-counter meds.  Encouraged hydration.  She will let us know if not improving.    Subjective:  HPI:  Patient here with cough and congestion. Started over a week ago. Symptoms include sore throat, headache, and ear fullness. She took a home covid test which was negative. Symptoms have persisted for the last several days. No ear pain.  She has tried several OTC medications with modest improvement. Cough seems to be worse at night.        Objective/Observations  Physical Exam: Gen: NAD, resting comfortably Pulm: Normal work of breathing Neuro: Grossly normal, moves all extremities Psych: Normal affect and thought content  Virtual Visit via Video   I connected with Stacey Barnett on 09/11/21 at  3:00 PM EDT by a video enabled telemedicine application and verified that I am speaking with the correct person using two identifiers. The limitations of evaluation and management by telemedicine and the availability of in person appointments were discussed. The patient expressed understanding and agreed to proceed.   Patient location: Home Provider location: [place] [employer] Persons participating in the virtual visit: Myself and Patient     Katina Degree. Jimmey Ralph, MD 09/11/2021 3:04 PM

## 2022-01-04 ENCOUNTER — Telehealth: Payer: Self-pay | Admitting: Primary Care

## 2022-01-04 NOTE — Telephone Encounter (Signed)
Patient called and stated she is experiencing dizziness, head is hurting, a little bit of a sore throat, Patient was sent to access nurse.

## 2022-01-04 NOTE — Telephone Encounter (Signed)
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 No available appts at [facility] or [facility]. Pt said she would go to [facility] in . Pt did not say she was going to leave work. Sending note to Audria Nine NP and [employer].

## 2022-01-04 NOTE — Telephone Encounter (Signed)
Agree that she needs to be evaluated

## 2022-01-04 NOTE — Telephone Encounter (Signed)
[facility] Day - Client TELEPHONE ADVICE RECORD [facility] Patient Name: Stacey Barnett [facility] Gender: Female DOB: 1980/03/16 Age: 41 Y 8 M 16 D Return Phone Number: [phone] (Primary) Address: City/ State/ Zip: [place] [address] Client [facility] Day - Client Client Site [facility] - Day Provider Vernona Rieger - NP Contact Type Call Who Is Calling Patient / Member / Family / Caregiver Call Type Triage / Clinical Relationship To Patient Self Return Phone Number [phone] (Primary) Chief Complaint Dizziness Reason for Call Symptomatic / Request for Health Information Initial Comment Pt is experiencing dizziness, sore throat and headache, Translation No Nurse Assessment Nurse: Elesa Hacker, RN, [name] Date/Time Lamount Cohen Time): 01/04/2022 10:25:15 AM Confirm and document reason for call. If symptomatic, describe symptoms. 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 Felt sweaty this am. Does the patient have any new or worsening symptoms? ---Yes Will a triage be completed? ---Yes Related visit to physician within the last 2 weeks? ---No Does the PT have any chronic conditions? (i.e. diabetes, asthma, this includes High risk factors for pregnancy, etc.) ---No Is the patient pregnant or possibly pregnant? (Ask all females between the ages of 26-55) ---No Is this a behavioral health or substance abuse call? ---No Guidelines Guideline Title Affirmed Question Affirmed Notes Nurse Date/Time (Eastern Time) Headache [1] MODERATE headache (e.g., interferes with normal activities) AND [2] present > 24 hours AND [3] unexplained (Exceptions: analgesics not tried, typical migraine, or Deaton, RN, [name] 01/04/2022 10:27:10 AM PLEASE NOTE: All timestamps contained within this report are represented as  [place] Standard Time. CONFIDENTIALTY NOTICE: This fax transmission is intended only for the addressee. It contains information that is legally privileged, confidential or otherwise protected from use or disclosure. If you are not the intended recipient, you are strictly prohibited from reviewing, disclosing, copying using or disseminating any of this information or taking any action in reliance on or regarding this information. If you have received this fax in error, please notify us immediately by telephone so that we can arrange for its return to [place]. Phone: [phone], Toll-Free: [phone], Fax: [phone] Page: 2 of 2 Call Id: [id number] Guidelines Guideline Title Affirmed Question Affirmed Notes Nurse Date/Time Lamount Cohen Time) headache part of viral illness) Disp. Time Lamount Cohen Time) Disposition Final User 01/04/2022 10:31:57 AM See PCP within 24 Hours Yes Deaton, RN, [name] Final Disposition 01/04/2022 10:31:57 AM See PCP within 24 Hours Yes Deaton, RN, Cory Roughen Disagree/Comply Comply Caller Understands Yes PreDisposition Did not know what to do Care Advice Given Per Guideline SEE PCP WITHIN 24 HOURS: * IF OFFICE WILL BE OPEN: You need to be examined within the next 24 hours. Call your doctor (or NP/PA) when the office opens and make an appointment. * ACETAMINOPHEN - EXTRA STRENGTH TYLENOL: Take 1,000 mg (two 500 mg pills) every 6 to 8 hours as needed. Each Extra Strength Tylenol pill has 500 mg of acetaminophen. The most you should take is 6 pills a day (3,000 mg total). Note: In [place], the maximum is 8 pills a day (4,000 mg total). * IBUPROFEN (E.G., MOTRIN, ADVIL): Take 400 mg (two 200 mg pills) by mouth every 6 hours. The most you should take is 6 pills a day (1,200 mg total). REST FOR HEADACHE: CARE ADVICE given per Headache (Adult) guideline. CALL BACK IF: * You become worse Comments User: Wandra Scot, RN Date/Time Lamount Cohen  Time): 01/04/2022 10:31:53 AM Caller  advised that she talked with the office and they have no appts today. Caller states that the main sx are a headache and sore throat, only dizzy at times, not now. 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 States that there is a lot of sickness, strep in the classroom. User: Wandra Scot, RN Date/Time Lamount Cohen Time): 01/04/2022 10:32:43 AM Caller advised that she has spoken with the office and that there are no appts. Unknown what she will do at this time. Referrals REFERRED TO PCP OFFIC

## 2022-02-23 ENCOUNTER — Telehealth: Payer: Self-pay | Admitting: Primary Care

## 2022-02-23 NOTE — Telephone Encounter (Signed)
Patient would like to know if there is any way possible that she can receive the medication that is giving when you have a colonoscopy to clean your system out? She said that she has been trying different things but nothing is working.

## 2022-02-24 ENCOUNTER — Telehealth: Payer: Self-pay

## 2022-02-24 NOTE — Telephone Encounter (Signed)
Called and advised patient she will need an appointment. She would prefer to do this virtually. She states she will call back to schedule.

## 2022-02-24 NOTE — Telephone Encounter (Signed)
Patient contacted office stating that for years she has suffered with constipation . Patient states that she has seen doctor about problem in past and was advised to take miralax or laxative for symptom relief. Patient states that she has tried miralax, laxatives and glycerin suppositories with no relief. Patient states that she is having a daily bowel movement but describes stools as small in size. Patient is wanting to know if she can be prescribed medication that is normal given for GI cleanse prior to colonoscopy procedure. Patient states that she feels if she drinks this drink it will help her eliminate stools. Patient was last seen in this office by Dr. Lauretta Chester 04/28/2021. Please review chart and advise. Patient states that we can respond back by phone or through mychart. KW

## 2022-02-24 NOTE — Telephone Encounter (Signed)
Left voicemail for patient to call back and schedule

## 2022-02-24 NOTE — Telephone Encounter (Signed)
Called and spoke to patient she states she has felt constipated on and off for months now, and states this goes along with her IBS that Anda Kraft is aware of. She states she moves her bowels most everyday however it is only a little bit at a time. She declines any abdominal pain and doesn't have any issues eating. She has tried OTC miralax and glycerin suppositories with no relief. She states she cannot do regular laxatives as this causes too much stomach cramping. Patient states she feels bloated and back up like she needs to be cleaned out, but overall she feels fine, not sick. This is where her request for the colonoscopy prep came from, she thinks if she can get cleaned out one good time then it would make her feel better. She declines appointment to discuss the ongoing issue at this time. 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 Please advise

## 2022-02-24 NOTE — Telephone Encounter (Signed)
Needs office visit. We can meet virtually if that is more convenient.

## 2022-02-24 NOTE — Telephone Encounter (Signed)
Unable to reach patient. Left voicemail to return call to our office.   

## 2022-02-25 NOTE — Telephone Encounter (Signed)
Patient was advised. KW 

## 2022-03-03 ENCOUNTER — Telehealth (INDEPENDENT_AMBULATORY_CARE_PROVIDER_SITE_OTHER): Payer: BC Managed Care – PPO | Admitting: Primary Care

## 2022-03-03 ENCOUNTER — Encounter: Payer: Self-pay | Admitting: Primary Care

## 2022-03-03 VITALS — Ht 64.0 in | Wt 155.0 lb

## 2022-03-03 DIAGNOSIS — K581 Irritable bowel syndrome with constipation: Secondary | ICD-10-CM | POA: Diagnosis not present

## 2022-03-03 MED ORDER — LINACLOTIDE 72 MCG PO CAPS: 72.0000 ug | ORAL_CAPSULE | Freq: Every day | ORAL | 0 refills | Status: DC

## 2022-03-03 NOTE — Assessment & Plan Note (Signed)
Stable but largely uncontrolled with daily symptoms.  Discussed options as she has exhausted numerous OTC products. Start Linzess 72 mcg daily PRN. Increase water/fiber intake.   Consider hyoscyamine/dicyclomine if needed. Consider switching paroxetine to another SSRI as constipation is a common side effect. Consider GI referral.  She will update.

## 2022-03-03 NOTE — Progress Notes (Signed)
Patient ID: Stacey Barnett, female    DOB: 11-06-1980, 42 y.o.   MRN: [id number]  Virtual visit completed through [place], a video enabled telemedicine application. Due to national recommendations of social distancing due to COVID-19, a virtual visit is felt to be most appropriate for this patient at this time. Reviewed limitations, risks, security and privacy concerns of performing a virtual visit and the availability of in person appointments. I also reviewed that there may be a patient responsible charge related to this service. The patient agreed to proceed.   Patient location: home Provider location: [facility], office Persons participating in this virtual visit: patient, provider   If any vitals were documented, they were collected by patient at home unless specified below.    Ht 5' 4"$  (1.626 m)   Wt 155 lb (70.3 kg)   BMI 26.61 kg/m    CC: Constipation Subjective:   HPI: Stacey Barnett is a 42 y.o. female with a history of IBS, constipation, GAD  presenting on 03/03/2022 for Constipation (Ongoing constipation )  Diagnosed with IBS during her teenage years for symptoms of abdominal bloating and constipation. She experiences small and firm bowel movements daily. She doesn't feel like she every fully empties. She is constantly bloated.   She's tried OTC laxative, Miralax, Glycerin suppositories, fleet enemas without much improvement. She feels that she needs a good "clean out".   She is managed on paroxetine for anxiety for which she's been taking for at least 10 years. She did see GI years ago as a teenage, no follow up since.  She is exercising regularly. She is drinking some water during the day. She eats a lot of protein bars.        Relevant past medical, surgical, family and social history reviewed and updated as indicated. Interim medical history since our last visit reviewed. Allergies and medications reviewed and updated. Outpatient Medications Prior to  Visit  Medication Sig Dispense Refill   Multiple Vitamin (MULTIVITAMIN) tablet Take 1 tablet by mouth daily.     norethindrone (MICRONOR) 0.35 MG tablet Take 1 tablet (0.35 mg total) by mouth daily. 84 tablet 4   PARoxetine (PAXIL) 20 MG tablet Take 1 tablet (20 mg total) by mouth daily. For anxiety. 90 tablet 3   SUMAtriptan (IMITREX) 50 MG tablet TAKE 1 TABLET BY MOUTH AT MIGRAINE ONSET. MAY REPEAT IN 2 HOURS IF HEADACHE PERSISTS OR RECURS. 10 tablet 0   valACYclovir (VALTREX) 1000 MG tablet At the onset of cold sores: take 2 tablets every 12 hours for 1 day. 8 tablet 0   azelastine (ASTELIN) 0.1 % nasal spray Place 2 sprays into both nostrils 2 (two) times daily. (Patient not taking: Reported on 03/03/2022) 30 mL 12   azithromycin (ZITHROMAX) 250 MG tablet Take 2 tabs day 1, then 1 tab daily (Patient not taking: Reported on 03/03/2022) 6 each 0   mupirocin ointment (BACTROBAN) 2 % Place 1 application into the nose 2 (two) times daily. (Patient not taking: Reported on 03/03/2022) 22 g 0   promethazine-dextromethorphan (PROMETHAZINE-DM) 6.25-15 MG/5ML syrup Take 5 mLs by mouth 4 (four) times daily as needed for cough. (Patient not taking: Reported on 03/03/2022) 118 mL 0   No facility-administered medications prior to visit.     Per HPI unless specifically indicated in ROS section below Review of Systems  Constitutional:  Negative for fever.  Gastrointestinal:  Positive for abdominal pain and constipation. Negative for diarrhea and nausea.  Abdominal bloating  Neurological:  Negative for headaches.   Objective:  Ht 5' 4"$  (1.626 m)   Wt 155 lb (70.3 kg)   BMI 26.61 kg/m   Wt Readings from Last 3 Encounters:  03/03/22 155 lb (70.3 kg)  09/11/21 155 lb (70.3 kg)  08/11/21 156 lb (70.8 kg)       Physical exam: General: Alert and oriented x 3, no distress, does not appear sickly  Pulmonary: Speaks in complete sentences without increased work of breathing, no cough during  visit.  Psychiatric: Normal mood, thought content, and behavior.     Results for orders placed or performed in visit on 08/11/21  Lipid panel  Result Value Ref Range   Cholesterol 139 0 - 200 mg/dL   Triglycerides 53.0 0.0 - 149.0 mg/dL   HDL 53.80 >39.00 mg/dL   VLDL 10.6 0.0 - 40.0 mg/dL   LDL Cholesterol 75 0 - 99 mg/dL   Total CHOL/HDL Ratio 3    NonHDL 85.34   CBC  Result Value Ref Range   WBC 4.3 4.0 - 10.5 K/uL   RBC 4.47 3.87 - 5.11 Mil/uL   Platelets 202.0 150.0 - 400.0 K/uL   Hemoglobin 13.9 12.0 - 15.0 g/dL   HCT 40.3 36.0 - 46.0 %   MCV 90.2 78.0 - 100.0 fl   MCHC 34.6 30.0 - 36.0 g/dL   RDW 12.9 11.5 - 15.5 %  Comprehensive metabolic panel  Result Value Ref Range   Sodium 138 135 - 145 mEq/L   Potassium 4.2 3.5 - 5.1 mEq/L   Chloride 104 96 - 112 mEq/L   CO2 27 19 - 32 mEq/L   Glucose, Bld 78 70 - 99 mg/dL   BUN 19 6 - 23 mg/dL   Creatinine, Ser 0.80 0.40 - 1.20 mg/dL   Total Bilirubin 0.8 0.2 - 1.2 mg/dL   Alkaline Phosphatase 72 39 - 117 U/L   AST 16 0 - 37 U/L   ALT 11 0 - 35 U/L   Total Protein 7.4 6.0 - 8.3 g/dL   Albumin 4.4 3.5 - 5.2 g/dL   GFR 90.95 >60.00 mL/min   Calcium 9.0 8.4 - 10.5 mg/dL   Assessment & Plan:   Problem List Items Addressed This Visit       Digestive   IBS (irritable bowel syndrome) - Primary    Stable but largely uncontrolled with daily symptoms.  Discussed options as she has exhausted numerous OTC products. Start Linzess 72 mcg daily PRN. Increase water/fiber intake.   Consider hyoscyamine/dicyclomine if needed. Consider switching paroxetine to another SSRI as constipation is a common side effect. Consider GI referral.  She will update.        Relevant Medications   linaclotide (LINZESS) 72 MCG capsule     Meds ordered this encounter  Medications   linaclotide (LINZESS) 72 MCG capsule    Sig: Take 1 capsule (72 mcg total) by mouth daily before breakfast. For constipation    Dispense:  30 capsule     Refill:  0    Order Specific Question:   Supervising Provider    Answer:   BEDSOLE, AMY E [[id number]]   No orders of the defined types were placed in this encounter.   I discussed the assessment and treatment plan with the patient. The patient was provided an opportunity to ask questions and all were answered. The patient agreed with the plan and demonstrated an understanding of the instructions. The patient was advised to call back  or seek an in-person evaluation if the symptoms worsen or if the condition fails to improve as anticipated.  Follow up plan:  Start Linzess 72 mcg capsules for constipation. Take 1 capsule daily with breakfast.   Increase water intake.  Please update me!  Pleas Koch, NP

## 2022-03-03 NOTE — Patient Instructions (Signed)
Start Linzess 72 mcg capsules for constipation. Take 1 capsule daily with breakfast.   Increase water intake.  Please update me!

## 2022-03-19 ENCOUNTER — Telehealth: Payer: Self-pay | Admitting: Primary Care

## 2022-03-19 NOTE — Telephone Encounter (Signed)
Pt called asking if it was fine to take 2 tablets instead of one of linaclotide (LINZESS) 72 MCG capsule? Due to one tablet is not working. Spoke to [place], she stated it was fine. Call back # [phone]

## 2022-03-22 NOTE — Telephone Encounter (Signed)
Yes, I did verbally agree to this.

## 2022-03-25 ENCOUNTER — Telehealth: Payer: Self-pay | Admitting: Primary Care

## 2022-03-25 DIAGNOSIS — K581 Irritable bowel syndrome with constipation: Secondary | ICD-10-CM

## 2022-03-25 NOTE — Addendum Note (Signed)
Addended by: Pleas Koch on: 03/25/2022 02:08 PM   Modules accepted: Orders

## 2022-03-25 NOTE — Telephone Encounter (Signed)
Patient called in and stated that the linaclotide (LINZESS) 72 MCG capsule isn't working much. She stated she would like to go ahead with the referral to the gastro. Please advise. Thank you!

## 2022-03-25 NOTE — Telephone Encounter (Signed)
Noted.  Notify patient that I sent a referral to GI in [place]  She will either be contacted via phone or she may receive a letter on her MyChart portal from our referral team with instructions for scheduling an appointment. Please let us know if you have not been contacted by anyone within two weeks.

## 2022-03-26 NOTE — Telephone Encounter (Signed)
Unable to reach patient. Left voicemail to return call to our office.   

## 2022-03-29 NOTE — Telephone Encounter (Signed)
Notified patient of referral placed. Advised to call back if she hasn't heard from anyone in 2 weeks.

## 2022-04-07 ENCOUNTER — Telehealth: Payer: Self-pay | Admitting: Primary Care

## 2022-04-07 NOTE — Telephone Encounter (Signed)
Patient was referred to Gastroenterology. She called and they are booked out for months,she tried another one and they are booked out as well.She would ,ike to know are there any other options for her?

## 2022-04-08 NOTE — Telephone Encounter (Signed)
Patient returned call,I have her scheduled to come in next week.

## 2022-04-08 NOTE — Telephone Encounter (Signed)
Called and advised patient of referral coordinator message. She states her symptoms are not improving at all since her video visit in Feb. She would like to know if there is anything else you can offer in the meantime to relieve some of her symptoms. She stated the Linzess and otc meds are not making any difference. Please advise.

## 2022-04-08 NOTE — Telephone Encounter (Signed)
Left message for patient to call back and schedule office visit.

## 2022-04-08 NOTE — Telephone Encounter (Signed)
She needs an in person office visit.  We can obtain some imaging in the office and go over other options.

## 2022-04-14 ENCOUNTER — Ambulatory Visit: Payer: BC Managed Care – PPO | Admitting: Primary Care

## 2022-04-21 ENCOUNTER — Ambulatory Visit: Payer: BC Managed Care – PPO | Admitting: Primary Care

## 2022-05-10 ENCOUNTER — Other Ambulatory Visit: Payer: Self-pay | Admitting: Obstetrics and Gynecology

## 2022-05-10 ENCOUNTER — Telehealth: Payer: Self-pay | Admitting: Primary Care

## 2022-05-10 DIAGNOSIS — G43829 Menstrual migraine, not intractable, without status migrainosus: Secondary | ICD-10-CM

## 2022-05-10 MED ORDER — SUMATRIPTAN SUCCINATE 50 MG PO TABS: ORAL_TABLET | ORAL | 0 refills | Status: DC

## 2022-05-10 NOTE — Telephone Encounter (Signed)
Prescription Request  05/10/2022  LOV: 08/11/2021  What is the name of the medication or equipment? SUMAtriptan (IMITREX) 50 MG tablet   Have you contacted your pharmacy to request a refill? No   Which pharmacy would you like this sent to?  [id number] [address] [place] [address] Phone: [phone] Fax: [phone]    Patient notified that their request is being sent to the clinical staff for review and that they should receive a response within 2 business days.   Please advise at Mobile [phone] (mobile)

## 2022-05-10 NOTE — Addendum Note (Signed)
Addended by: [name] on: 05/10/2022 03:49 PM   Modules accepted: Orders

## 2022-05-26 ENCOUNTER — Telehealth (INDEPENDENT_AMBULATORY_CARE_PROVIDER_SITE_OTHER): Payer: BC Managed Care – PPO | Admitting: Gastroenterology

## 2022-05-26 ENCOUNTER — Other Ambulatory Visit: Payer: Self-pay

## 2022-05-26 DIAGNOSIS — K581 Irritable bowel syndrome with constipation: Secondary | ICD-10-CM

## 2022-05-26 NOTE — Progress Notes (Signed)
Stacey Minium, MD [address]  [address]  [place], [place] [address]  Main: [phone]  Fax: [phone]    Gastroenterology Virtual/Video Visit  Referring Provider:     [name], NP Primary Care Physician:  [name], NP Primary Gastroenterologist:  Dr.Tura Roller Servando Snare Reason for Consultation:     Irritable bowel syndrome with constipation        HPI:    Virtual Visit via Video Note Location of the patient: Work Location of provider: Home Office Participating persons: The patient and myself.  I connected with Stacey Barnett on 05/26/22 at  9:45 AM EDT by a video enabled telemedicine application and verified that I am speaking with the correct person using two identifiers.   I discussed the limitations of evaluation and management by telemedicine and the availability of in person appointments. The patient expressed understanding and agreed to proceed.  Verbal consent to proceed obtained.  History of Present Illness: Stacey Barnett is a 42 y.o. female referred by Dr. Chestine Spore, Keane Scrape, NP  for consultation & management of irritable bowel syndrome with constipation.  This patient reports that she was diagnosed with irritable bowel syndrome when she was a teenager and has not followed up with a GI doctor since then.  The patient states that she has tried over-the-counter laxatives including MiraLAX glycerin suppositories and fleets enemas without much improvement.  She had reported to her primary care provider that she felt like she was full of stool and never completely empties.  She also reports chronic bloating.  She also endorsed drinking of water throughout the day and eating lots of protein bars.  The patient was started on Linzess 72 mcg a day and had called her PCP wanting to increase it to 2 tablets a day due to the 1 tablet not working.  The patient started on 2 tablets a day but did not have any relief therefore requested to see GI for consultation. The  patient reports that her symptoms have just become more bothersome as she has gotten older.    Past Medical History:  Diagnosis Date   Anxiety    Herpes labialis    IBS (irritable bowel syndrome)    Migraines    Premature rupture of membranes 05/05/2012   Supervision of other normal pregnancy 11/12/2011   Vaginal delivery 05/06/2012    Past Surgical History:  Procedure Laterality Date   AUGMENTATION MAMMAPLASTY Bilateral    saline   bunyons     MOLE REMOVAL     WISDOM TOOTH EXTRACTION      Prior to Admission medications   Medication Sig Start Date End Date Taking? Authorizing Provider  azelastine (ASTELIN) 0.1 % nasal spray Place 2 sprays into both nostrils 2 (two) times daily. Patient not taking: Reported on 03/03/2022 09/11/21   Ardith Dark, MD  linaclotide Karlene Einstein) 72 MCG capsule Take 1 capsule (72 mcg total) by mouth daily before breakfast. For constipation 03/03/22   [name], NP  Multiple Vitamin (MULTIVITAMIN) tablet Take 1 tablet by mouth daily.    [provider]  mupirocin ointment (BACTROBAN) 2 % Place 1 application into the nose 2 (two) times daily. Patient not taking: Reported on 03/03/2022 02/07/18   [name], NP  norethindrone (MICRONOR) 0.35 MG tablet Take 1 tablet (0.35 mg total) by mouth daily. 04/28/21   Federico Flake, MD  PARoxetine (PAXIL) 20 MG tablet Take 1 tablet (20 mg total) by mouth daily. For anxiety. 08/26/21  [name], NP  SUMAtriptan (IMITREX) 50 MG tablet Take 1 tablet by mouth at migraine onset. May repeat in 2 hours if headache persists or recurs. 05/10/22   [name], NP  valACYclovir (VALTREX) 1000 MG tablet At the onset of cold sores: take 2 tablets every 12 hours for 1 day. 04/28/21   Federico Flake, MD    Family History  Problem Relation Age of Onset   Stroke Maternal Grandmother    Diabetes Maternal Grandmother    Stroke Paternal Grandfather    Cancer Maternal Grandfather 52        prostate cancer   Heart disease Paternal Grandmother      Social History   Tobacco Use   Smoking status: Never   Smokeless tobacco: Never  Vaping Use   Vaping Use: Never used  Substance Use Topics   Alcohol use: Yes    Comment: Occasionally    Drug use: No    Allergies as of 05/26/2022   (No Known Allergies)    Review of Systems:    All systems reviewed and negative except where noted in HPI.   Observations/Objective:  Labs: CBC    Component Value Date/Time   WBC 4.3 08/11/2021 0911   RBC 4.47 08/11/2021 0911   HGB 13.9 08/11/2021 0911   HCT 40.3 08/11/2021 0911   PLT 202.0 08/11/2021 0911   MCV 90.2 08/11/2021 0911   MCHC 34.6 08/11/2021 0911   RDW 12.9 08/11/2021 0911   LYMPHSABS 2.3 05/08/2013 1659   MONOABS 0.4 05/08/2013 1659   EOSABS 1.0 (H) 05/08/2013 1659   BASOSABS 0.1 05/08/2013 1659   CMP     Component Value Date/Time   NA 138 08/11/2021 0911   K 4.2 08/11/2021 0911   CL 104 08/11/2021 0911   CO2 27 08/11/2021 0911   GLUCOSE 78 08/11/2021 0911   BUN 19 08/11/2021 0911   CREATININE 0.80 08/11/2021 0911   CREATININE 0.82 01/10/2017 1203   CALCIUM 9.0 08/11/2021 0911   PROT 7.4 08/11/2021 0911   ALBUMIN 4.4 08/11/2021 0911   AST 16 08/11/2021 0911   ALT 11 08/11/2021 0911   ALKPHOS 72 08/11/2021 0911   BILITOT 0.8 08/11/2021 0911    Imaging Studies: No results found.  Assessment and Plan:   Stacey Barnett is a 42 y.o. y/o female has been referred for constipation with no help taking 72 mcg of Linzess and doubling up to 2 pills a day.  The patient has been told to increase her dose to 4 pills a day which is nearly equivalent to the 290 mcg dosing.  She has been told to give that a few days to see if it works and if it does not then she should add MiraLAX and see if that helps move her bowels.  The patient will also be set up for colonoscopy due to her constipation.  The patient has been explained the plan agrees with it.  Follow Up  Instructions:  I discussed the assessment and treatment plan with the patient. The patient was provided an opportunity to ask questions and all were answered. The patient agreed with the plan and demonstrated an understanding of the instructions.   The patient was advised to call back or seek an in-person evaluation if the symptoms worsen or if the condition fails to improve as anticipated.  I provided 25 minutes of non-face-to-face time during this encounter including chart review In preparation for the encounter.   Stacey Minium, MD  Speech  recognition software was used to dictate the above note.

## 2022-06-07 ENCOUNTER — Other Ambulatory Visit: Payer: Self-pay

## 2022-06-07 DIAGNOSIS — K5900 Constipation, unspecified: Secondary | ICD-10-CM

## 2022-06-07 MED ORDER — NA SULFATE-K SULFATE-MG SULF 17.5-3.13-1.6 GM/177ML PO SOLN: 1.0000 | Freq: Once | ORAL | 0 refills | Status: AC

## 2022-06-20 ENCOUNTER — Other Ambulatory Visit: Payer: Self-pay | Admitting: Family Medicine

## 2022-06-20 DIAGNOSIS — G43829 Menstrual migraine, not intractable, without status migrainosus: Secondary | ICD-10-CM

## 2022-06-21 IMAGING — MG DIGITAL SCREENING BREAST BILAT IMPLANT W/ TOMO W/ CAD
8 of 12 series · 8 of 28 positions shown · non-contrast
Comparison: None.

CLINICAL DATA: Screening.

EXAM:
DIGITAL SCREENING BILATERAL MAMMOGRAM WITH IMPLANTS, CAD AND
TOMOSYNTHESIS
TECHNIQUE: Bilateral screening digital craniocaudal and mediolateral oblique
mammograms were obtained. Bilateral screening digital breast
tomosynthesis was performed. The images were evaluated with
computer-aided detection. Standard and/or implant displaced views
were performed.

[L CC]
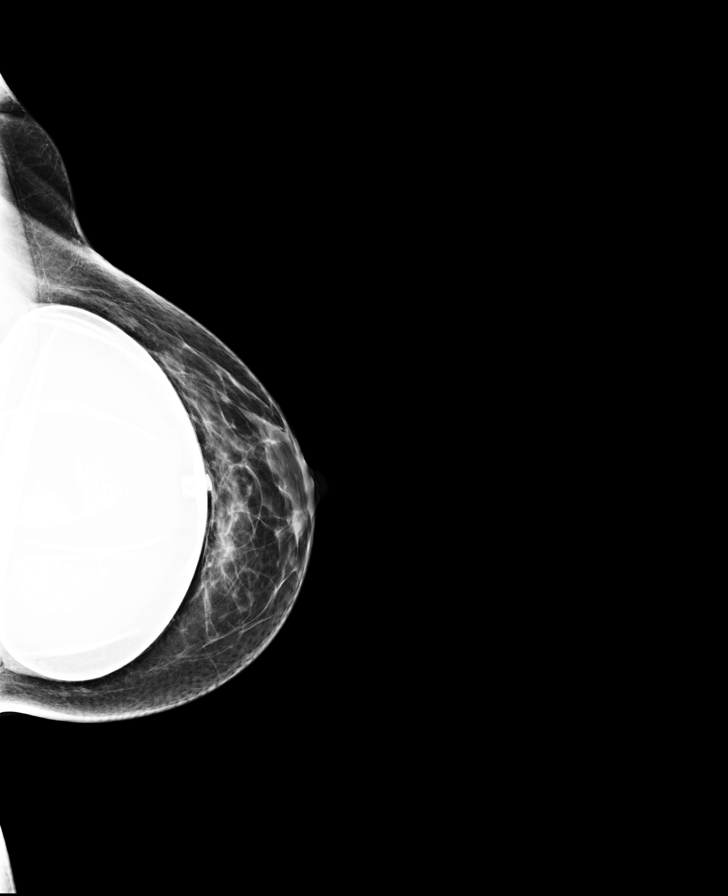

[R MLO]
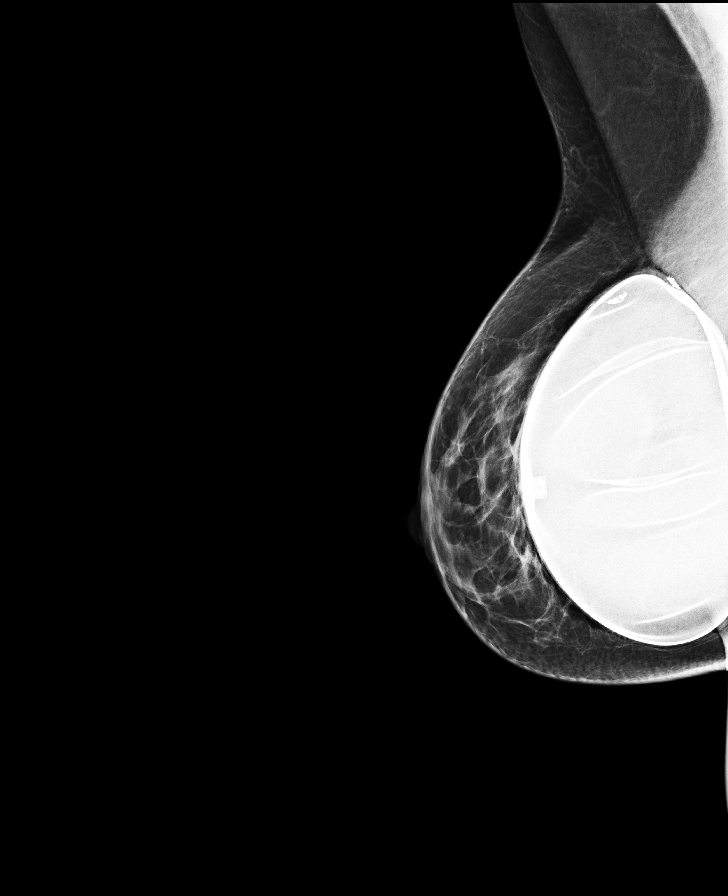

[L MLO]
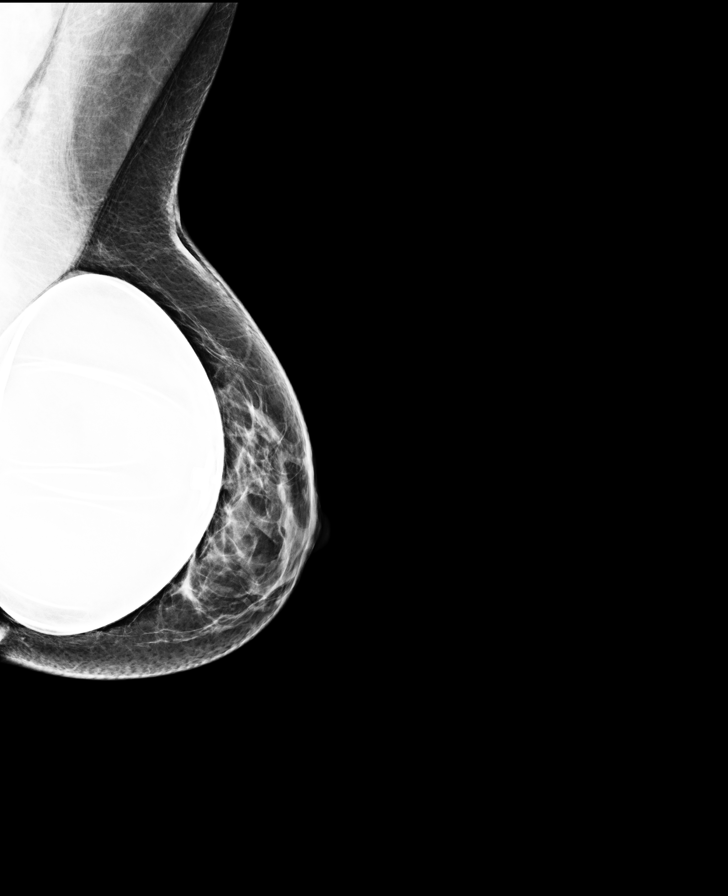

[R CC]
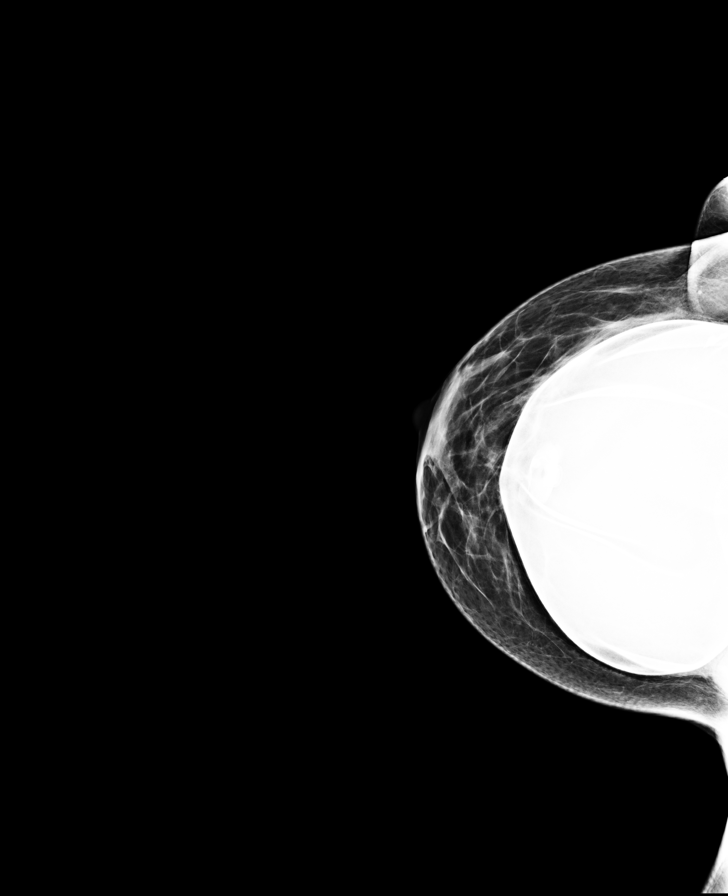

[R CC synth-2D]
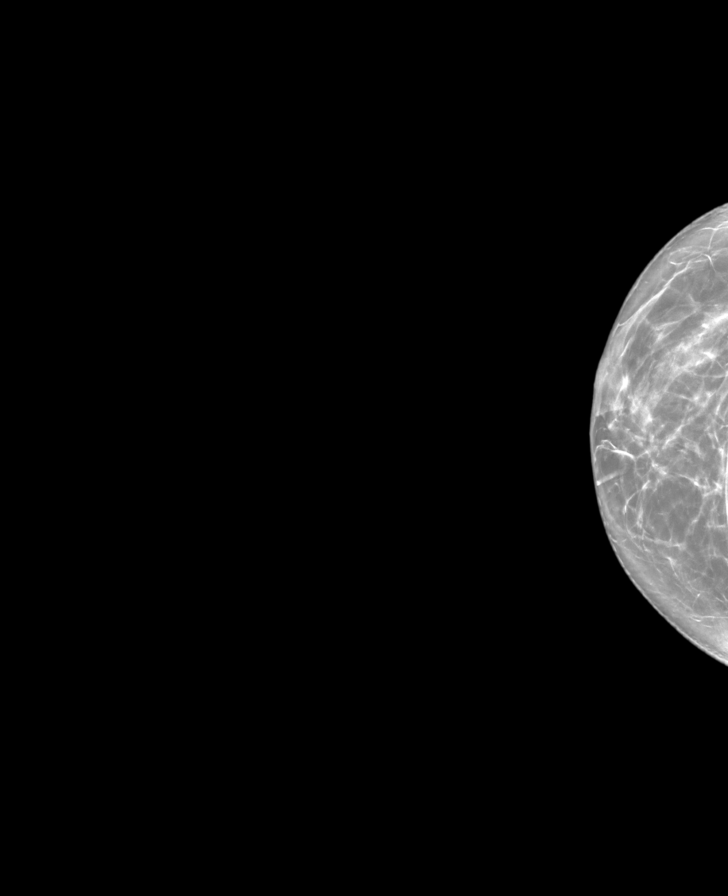

[L MLO synth-2D]
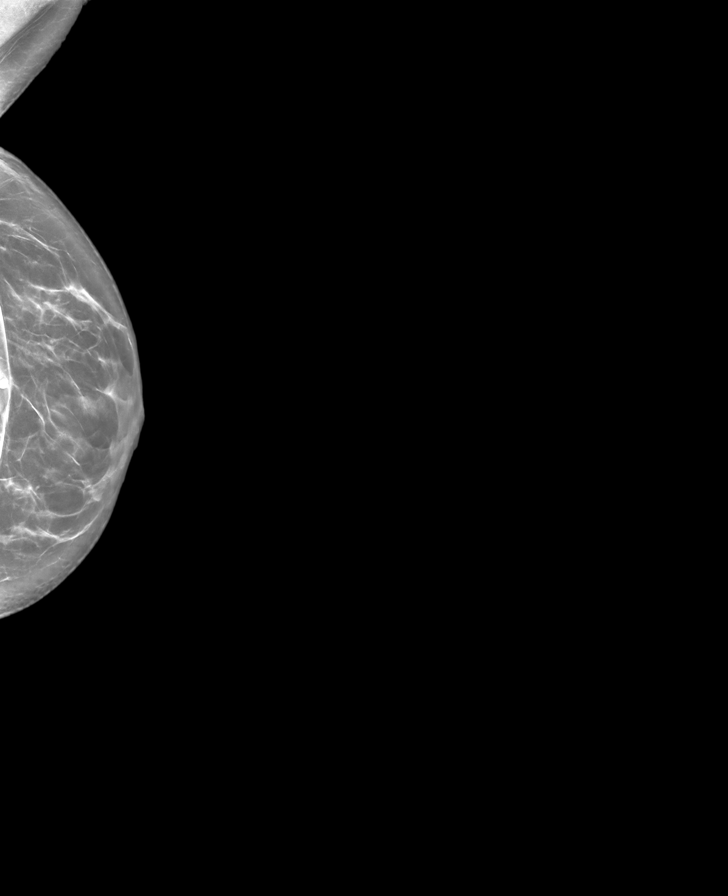

[L CC synth-2D]
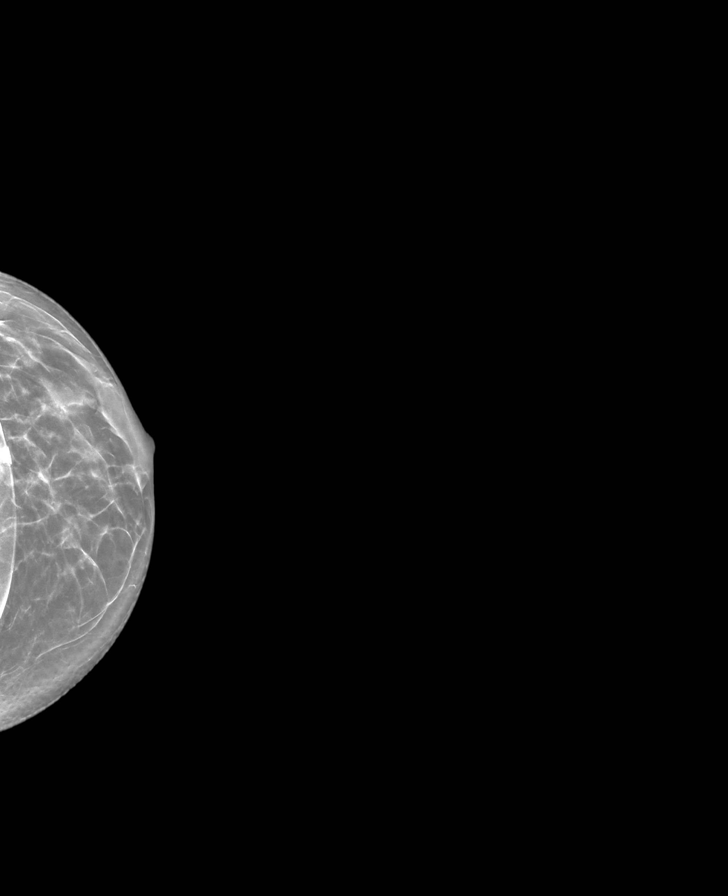

[R MLO synth-2D]
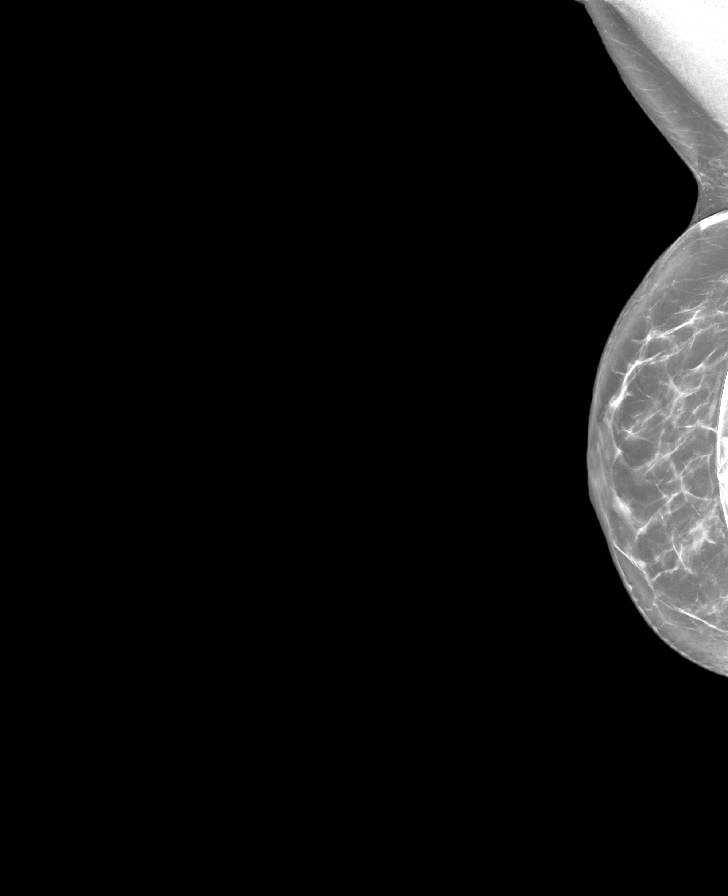

[8 of 28 positions shown; findings below may reference images not displayed]

ACR Breast Density Category b: There are scattered areas of
fibroglandular density.
FINDINGS: The patient has prepectoral implants. There are no findings
suspicious for malignancy.
IMPRESSION: No mammographic evidence of malignancy. A result letter of this
screening mammogram will be mailed directly to the patient.

RECOMMENDATION:
Screening mammogram in one year. (Code:F6-2-26K)

BI-RADS CATEGORY  1:  Negative.

## 2022-06-29 ENCOUNTER — Other Ambulatory Visit: Payer: Self-pay | Admitting: Obstetrics and Gynecology

## 2022-06-29 ENCOUNTER — Telehealth: Payer: Self-pay

## 2022-06-29 DIAGNOSIS — G43829 Menstrual migraine, not intractable, without status migrainosus: Secondary | ICD-10-CM

## 2022-06-29 MED ORDER — NORETHINDRONE 0.35 MG PO TABS
1.0000 | ORAL_TABLET | Freq: Every day | ORAL | 0 refills | Status: DC
Start: 2022-06-29 — End: 2022-07-12

## 2022-06-29 NOTE — Telephone Encounter (Signed)
Rx RF POPs eRxd. Estrogen BC usually causes melasma but pt not on estrogen. Could stop POPs to see if sx improve.

## 2022-06-29 NOTE — Telephone Encounter (Signed)
The patient called back and is scheduled for annual exam with ABC 07/12/22.

## 2022-06-29 NOTE — Telephone Encounter (Signed)
Pt calling; has questions.  [phone]  pt's bc has not been approved; has been off of them completely for 2d now; also has melasma and is wondering if being off the bc will help manage that.  What is the best route to go?  Adv pt needs to have an annual and that this is a conversation that needs to be face to face. Adv pt to keep journal of sxs being off the bc.

## 2022-06-29 NOTE — Progress Notes (Signed)
Rx RF POPs till 6/24 annual

## 2022-06-29 NOTE — Telephone Encounter (Signed)
I contacted the patient via phone. I left generic message for the patient to call back to be scheduled. 

## 2022-06-30 NOTE — Telephone Encounter (Signed)
Pt aware.

## 2022-07-08 NOTE — Progress Notes (Signed)
PCP:  [name], NP   Chief Complaint  Patient presents with   Gynecologic Exam    Discuss BC due to melasma     HPI:      [name] is a 42 y.o. [id number] whose LMP was Patient's last menstrual period was 07/11/2022 (exact date)., presents today for her annual examination.  Her menses are regular every 28-30 days, lasting 3-4 days.  Dysmenorrhea none, occas BTB on POPs. Has menstrual migraines, sx improved with POPs compared to OCPs. Pt also with melasma. No change with POPs vs OCPs. Has tried hydroquinone without relief. Rx POPs ran out a couple wks ago, no change in melasma yet.   Sex activity: single partner, contraception - POPs, condoms recently. Pt doesn't want conception. No pain/bleeding.  Last Pap: 08/08/20 Results were: no abnormalities /neg HPV DNA   Last mammogram: 08/13/21 Results were: normal--routine follow-up in 12 months There is no FH of breast cancer. There is no FH of ovarian cancer. The patient does not do self-breast exams.  Tobacco use: The patient denies current or previous tobacco use. Alcohol use: rare No drug use.  Exercise: very active  She does get adequate calcium but not Vitamin D in her diet.  Patient Active Problem List   Diagnosis Date Noted   Chronic pain of left upper extremity 08/11/2021   Menstrual headache 06/25/2019   Finger numbness 04/05/2018   IBS (irritable bowel syndrome) 04/05/2018   Nasal vestibulitis 02/07/2018   Encounter for surveillance of contraceptive pills 09/12/2015   Encounter for annual general medical examination with abnormal findings in adult 09/12/2015   Epidermal cyst 11/21/2014   H/O cold sores 11/21/2014   Constipation 05/08/2013   Fatigue 05/08/2013   Generalized anxiety disorder 11/27/2012   Swelling of both ankles 05/05/2012    Past Surgical History:  Procedure Laterality Date   AUGMENTATION MAMMAPLASTY Bilateral    saline   bunyons     MOLE REMOVAL     WISDOM TOOTH EXTRACTION       Family History  Problem Relation Age of Onset   Stroke Maternal Grandmother    Diabetes Maternal Grandmother    Stroke Paternal Grandfather    Cancer Maternal Grandfather 45       prostate cancer   Heart disease Paternal Grandmother     Social History   Socioeconomic History   Marital status: Married    Spouse name: Josh   Number of children: 3   Years of education: 16   Highest education level: Not on file  Occupational History   Occupation: Stay at home mother    Comment: Mother of 3 children   Occupation: Runner, broadcasting/film/video    Comment: Currently not working  Tobacco Use   Smoking status: Never   Smokeless tobacco: Never  Vaping Use   Vaping Use: Never used  Substance and Sexual Activity   Alcohol use: Yes    Comment: Occasionally    Drug use: No   Sexual activity: Yes    Birth control/protection: None  Other Topics Concern   Not on file  Social History Narrative   Stacey Barnett grew up in [place], [place]. She attended [employer] in [place], [place] where she obtained her Bachelors Degree in [employer].    Works teaching second grade.   Enjoys going to the movies, getting pedicures.    Social Determinants of Health   Financial Resource Strain: Not on file  Food Insecurity: Not on file  Transportation Needs: Not on  file  Physical Activity: Not on file  Stress: Not on file  Social Connections: Not on file  Intimate Partner Violence: Not on file     Current Outpatient Medications:    Multiple Vitamin (MULTIVITAMIN) tablet, Take 1 tablet by mouth daily., Disp: , Rfl:    PARoxetine (PAXIL) 20 MG tablet, Take 1 tablet (20 mg total) by mouth daily. For anxiety., Disp: 90 tablet, Rfl: 3   SUMAtriptan (IMITREX) 50 MG tablet, Take 1 tablet by mouth at migraine onset. May repeat in 2 hours if headache persists or recurs., Disp: 10 tablet, Rfl: 0   valACYclovir (VALTREX) 1000 MG tablet, At the onset of cold sores: take 2 tablets every 12 hours for 1 day., Disp: 8  tablet, Rfl: 0   norethindrone (MICRONOR) 0.35 MG tablet, Take 1 tablet (0.35 mg total) by mouth daily., Disp: 84 tablet, Rfl: 3     ROS:  Review of Systems  Constitutional:  Negative for fatigue, fever and unexpected weight change.  Respiratory:  Negative for cough, shortness of breath and wheezing.   Cardiovascular:  Negative for chest pain, palpitations and leg swelling.  Gastrointestinal:  Negative for blood in stool, constipation, diarrhea, nausea and vomiting.  Endocrine: Negative for cold intolerance, heat intolerance and polyuria.  Genitourinary:  Negative for dyspareunia, dysuria, flank pain, frequency, genital sores, hematuria, menstrual problem, pelvic pain, urgency, vaginal bleeding, vaginal discharge and vaginal pain.  Musculoskeletal:  Negative for back pain, joint swelling and myalgias.  Skin:  Negative for rash.  Neurological:  Negative for dizziness, syncope, light-headedness, numbness and headaches.  Hematological:  Negative for adenopathy.  Psychiatric/Behavioral:  Negative for agitation, confusion, sleep disturbance and suicidal ideas. The patient is not nervous/anxious.    BREAST: No symptoms   Objective: BP 100/68   Ht 5\' 4"  (1.626 m)   Wt 156 lb (70.8 kg)   LMP 07/11/2022 (Exact Date)   BMI 26.78 kg/m    Physical Exam Constitutional:      Appearance: She is well-developed.  Genitourinary:     Vulva normal.     Right Labia: No rash, tenderness or lesions.    Left Labia: No tenderness, lesions or rash.    No vaginal discharge, erythema or tenderness.      Right Adnexa: not tender and no mass present.    Left Adnexa: not tender and no mass present.    No cervical friability or polyp.     Uterus is not enlarged or tender.  Breasts:    Right: No mass, nipple discharge, skin change or tenderness.     Left: No mass, nipple discharge, skin change or tenderness.  Neck:     Thyroid: No thyromegaly.  Cardiovascular:     Rate and Rhythm: Normal rate and  regular rhythm.     Heart sounds: Normal heart sounds. No murmur heard. Pulmonary:     Effort: Pulmonary effort is normal.     Breath sounds: Normal breath sounds.  Abdominal:     Palpations: Abdomen is soft.     Tenderness: There is no abdominal tenderness. There is no guarding or rebound.  Musculoskeletal:        General: Normal range of motion.     Cervical back: Normal range of motion.  Lymphadenopathy:     Cervical: No cervical adenopathy.  Neurological:     General: No focal deficit present.     Mental Status: She is alert and oriented to person, place, and time.     Cranial Nerves: No  cranial nerve deficit.  Skin:    General: Skin is warm and dry.  Psychiatric:        Mood and Affect: Mood normal.        Behavior: Behavior normal.        Thought Content: Thought content normal.        Judgment: Judgment normal.  Vitals reviewed.     Assessment/Plan: Encounter for annual routine gynecological examination  Encounter for screening mammogram for malignant neoplasm of breast - Plan: MM 3D SCREENING MAMMOGRAM BILATERAL BREAST; pt to schedule mammo  Encounter for surveillance of contraceptive pills - Plan: norethindrone (MICRONOR) 0.35 MG tablet; Rx RF. Restart today, condoms for 7 days.   Menstrual migraine without status migrainosus, not intractable - Plan: norethindrone (MICRONOR) 0.35 MG tablet; improved with POPs  Melasma--discussed IUD vs POPs. Pt would rather do IUD. Suggested mineral sunscreen and OTC products now at [facility]. F/u prn.   Meds ordered this encounter  Medications   norethindrone (MICRONOR) 0.35 MG tablet    Sig: Take 1 tablet (0.35 mg total) by mouth daily.    Dispense:  84 tablet    Refill:  3    Order Specific Question:   Supervising Provider    Answer:   Waymon Budge             GYN counsel breast self exam, mammography screening, adequate intake of calcium and vitamin D, diet and exercise     F/U  Return in about 1 year (around  07/12/2023).  Haniel Fix B. Gabreille Dardis, PA-C 07/12/2022 11:33 AM

## 2022-07-12 ENCOUNTER — Ambulatory Visit (INDEPENDENT_AMBULATORY_CARE_PROVIDER_SITE_OTHER): Payer: BC Managed Care – PPO | Admitting: Obstetrics and Gynecology

## 2022-07-12 ENCOUNTER — Encounter: Payer: Self-pay | Admitting: Obstetrics and Gynecology

## 2022-07-12 VITALS — BP 100/68 | Ht 64.0 in | Wt 156.0 lb

## 2022-07-12 DIAGNOSIS — G43829 Menstrual migraine, not intractable, without status migrainosus: Secondary | ICD-10-CM

## 2022-07-12 DIAGNOSIS — Z1231 Encounter for screening mammogram for malignant neoplasm of breast: Secondary | ICD-10-CM

## 2022-07-12 DIAGNOSIS — Z01419 Encounter for gynecological examination (general) (routine) without abnormal findings: Secondary | ICD-10-CM

## 2022-07-12 DIAGNOSIS — L811 Chloasma: Secondary | ICD-10-CM

## 2022-07-12 DIAGNOSIS — Z3041 Encounter for surveillance of contraceptive pills: Secondary | ICD-10-CM

## 2022-07-12 MED ORDER — NORETHINDRONE 0.35 MG PO TABS
1.0000 | ORAL_TABLET | Freq: Every day | ORAL | 3 refills | Status: DC
Start: 2022-07-12 — End: 2023-07-09

## 2022-07-12 NOTE — Patient Instructions (Addendum)
I value your feedback and you entrusting us with your care. If you get a [facility] patient survey, I would appreciate you taking the time to let us know about your experience today. Thank you!  [facility] ([place])--[phone]  

## 2022-07-26 ENCOUNTER — Ambulatory Visit
Admission: RE | Admit: 2022-07-26 | Payer: BC Managed Care – PPO | Source: Home / Self Care | Admitting: Gastroenterology

## 2022-07-26 ENCOUNTER — Encounter: Admission: RE | Payer: Self-pay | Source: Home / Self Care

## 2022-07-26 SURGERY — COLONOSCOPY WITH PROPOFOL
Anesthesia: Choice

## 2022-08-17 ENCOUNTER — Ambulatory Visit
Admission: RE | Admit: 2022-08-17 | Discharge: 2022-08-17 | Disposition: A | Discharge status: Home / Self Care | Payer: BC Managed Care – PPO | Source: Ambulatory Visit | Attending: Obstetrics and Gynecology | Admitting: Obstetrics and Gynecology

## 2022-08-17 DIAGNOSIS — Z1231 Encounter for screening mammogram for malignant neoplasm of breast: Secondary | ICD-10-CM | POA: Insufficient documentation

## 2022-08-18 ENCOUNTER — Other Ambulatory Visit: Payer: Self-pay | Admitting: Primary Care

## 2022-08-18 DIAGNOSIS — F411 Generalized anxiety disorder: Secondary | ICD-10-CM

## 2022-08-18 NOTE — Telephone Encounter (Signed)
Prescription Request  08/18/2022  LOV: Visit date not found  What is the name of the medication or equipment?  PARoxetine HCl 20 mg Oral Daily, For anxiety.  Have you contacted your pharmacy to request a refill? No   Which pharmacy would you like this sent to?  [id number] [address] [place] [address] Phone: [phone] Fax: [phone]    Patient notified that their request is being sent to the clinical staff for review and that they should receive a response within 2 business days.   Please advise at [phone]

## 2022-08-19 NOTE — Telephone Encounter (Signed)
Lvm for patient tcb and schedule 

## 2022-08-19 NOTE — Telephone Encounter (Signed)
Patient is overdue for CPE/follow up, this will be required prior to any further refills.  Please schedule, thank you!   

## 2022-09-15 ENCOUNTER — Other Ambulatory Visit: Payer: Self-pay | Admitting: Primary Care

## 2022-09-15 DIAGNOSIS — F411 Generalized anxiety disorder: Secondary | ICD-10-CM

## 2022-09-15 NOTE — Telephone Encounter (Signed)
Patient is overdue for follow up, this will be required prior to any further refills.  Please schedule, thank you!

## 2022-09-16 NOTE — Telephone Encounter (Signed)
Patient has been scheduled

## 2022-09-28 ENCOUNTER — Ambulatory Visit: Payer: BC Managed Care – PPO | Admitting: Primary Care

## 2022-09-29 ENCOUNTER — Encounter: Payer: Self-pay | Admitting: Primary Care

## 2022-10-16 ENCOUNTER — Other Ambulatory Visit: Payer: Self-pay | Admitting: Primary Care

## 2022-10-16 DIAGNOSIS — F411 Generalized anxiety disorder: Secondary | ICD-10-CM

## 2023-01-28 ENCOUNTER — Telehealth: Payer: Self-pay | Admitting: Primary Care

## 2023-01-28 NOTE — Telephone Encounter (Signed)
 Called pt to schedule appt and pt stated that she's not coming in for a  li circle she will just put some cream on it

## 2023-01-28 NOTE — Telephone Encounter (Signed)
 Copied from CRM [id number]. Topic: Clinical - Medication Question >> Jan 28, 2023 12:34 PM Montie POUR wrote: Reason for CRM: Stacey Barnett has a place on her left upper arm for a couple and it is not spreading. She thinks it is ring worm. It is smaller than a dime. No itching. 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 Please call and let her know what is the best thing to use for this or can doctor call in a cream that would be better

## 2023-02-21 ENCOUNTER — Other Ambulatory Visit: Payer: Self-pay | Admitting: Primary Care

## 2023-02-21 DIAGNOSIS — F411 Generalized anxiety disorder: Secondary | ICD-10-CM

## 2023-02-21 DIAGNOSIS — G43829 Menstrual migraine, not intractable, without status migrainosus: Secondary | ICD-10-CM

## 2023-02-21 NOTE — Telephone Encounter (Signed)
Last Fill: 09/15/22  Last OV: 03/03/22 Next OV: 02/23/23  Routing to provider for review/authorization.

## 2023-02-21 NOTE — Telephone Encounter (Signed)
Copied from CRM [id number]. Topic: Clinical - Medication Refill >> Feb 21, 2023  5:17 PM Stacey Barnett wrote: Most Recent Primary Care Visit:  Provider: [name]  Department: [facility]  Visit Type: Earleen Reaper VIDEO VISIT  Date: 03/03/2022  Medication: PARoxetine (PAXIL) 20 MG tablet  Has the patient contacted their pharmacy? Yes (Agent: If no, request that the patient contact the pharmacy for the refill. If patient does not wish to contact the pharmacy document the reason why and proceed with request.) (Agent: If yes, when and what did the pharmacy advise?)  Is this the correct pharmacy for this prescription? Yes If no, delete pharmacy and type the correct one.  This is the patient's preferred pharmacy:  [id number] - [address] [place] [address] Phone: [phone] Fax: [phone]   Has the prescription been filled recently? Yes  Is the patient out of the medication? Yes  Has the patient been seen for an appointment in the last year OR does the patient have an upcoming appointment? Yes  Can we respond through MyChart? Yes  Agent: Please be advised that Rx refills may take up to 3 business days. We ask that you follow-up with your pharmacy.

## 2023-02-23 ENCOUNTER — Telehealth: Payer: Self-pay

## 2023-02-23 ENCOUNTER — Ambulatory Visit: Payer: 59 | Admitting: Primary Care

## 2023-02-23 MED ORDER — PAROXETINE HCL 20 MG PO TABS
20.0000 mg | ORAL_TABLET | Freq: Every day | ORAL | 0 refills | Status: DC
Start: 2023-02-23 — End: 2023-03-03

## 2023-02-23 NOTE — Telephone Encounter (Signed)
 Noted

## 2023-02-23 NOTE — Telephone Encounter (Signed)
 Unable to reach patient. Left voicemail to return call to our office.

## 2023-02-23 NOTE — Telephone Encounter (Signed)
 That's fine. Did she run out or has she still been taking the paroxetine ?   If she ran out, then have her start with 1/2 tablet for 5-7 days, then increase to a full tab. We will see her on 03/08/23.  Let me know and I will order.

## 2023-02-23 NOTE — Telephone Encounter (Signed)
 [facility] rep called stating pt said she is completely out of meds. Relayed Clark's message to rep to tell pt. Also let rep know pt's appt needed to r/s to earlier time on 2/18 or another date due to [facility] being out of office that afternoon for meeting. Call back # [phone]

## 2023-02-23 NOTE — Telephone Encounter (Signed)
 Copied from CRM [id number]. Topic: Clinical - Medication Question >> Feb 23, 2023  7:44 AM Pinkey ORN wrote: Reason for CRM: PARoxetine  (PAXIL ) 20 MG tablet >> Feb 23, 2023  7:46 AM Pinkey ORN wrote: Patient is requesting PARoxetine  (PAXIL ) 20 MG tablet be given just enough to hold her over until she attends her scheduled appointment Tuesday, February 18th

## 2023-02-23 NOTE — Telephone Encounter (Signed)
 Copied from CRM [id number]. Topic: Clinical - Prescription Issue >> Feb 23, 2023 12:29 PM Graeme ORN wrote: Reason for CRM: Patient returning call to Kelli. 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 Spoke with CAL. Will be calling patient to get her schedule. Thank You

## 2023-02-28 NOTE — Telephone Encounter (Signed)
 Please call patient:  I sent a prescription for 15 pills of her paroxetine  to her pharmacy on 02/23/2023.  Has she contacted her pharmacy?  Also, I can no longer provide refills unless she is seen in the office.  We can accommodate a later appointment including 3:20, 3:40 ir 4 pm.

## 2023-02-28 NOTE — Telephone Encounter (Signed)
 Copied from CRM [id number]. 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 The patient has asked if the provider can refill the prescription for PARoxetine  (PAXIL ) 20 MG tablet before she goes out of town Friday on 03/04/2023. Patient stated she does not have any new health changes or concerns. Please follow up with patient [phone] she cannot answer please leave voicemail or message on MyChart)

## 2023-03-01 NOTE — Telephone Encounter (Signed)
Unable to reach patient. Left detailed voicemail per DPR advising patient of Jae Dire message.

## 2023-03-02 NOTE — Telephone Encounter (Signed)
Patient has appointment scheduled for 03/03/23

## 2023-03-03 ENCOUNTER — Encounter: Payer: Self-pay | Admitting: Primary Care

## 2023-03-03 ENCOUNTER — Ambulatory Visit: Payer: 59 | Admitting: Primary Care

## 2023-03-03 VITALS — BP 104/72 | HR 66 | Temp 97.4°F | Ht 64.0 in | Wt 161.0 lb

## 2023-03-03 DIAGNOSIS — G43829 Menstrual migraine, not intractable, without status migrainosus: Secondary | ICD-10-CM

## 2023-03-03 DIAGNOSIS — Z8619 Personal history of other infectious and parasitic diseases: Secondary | ICD-10-CM

## 2023-03-03 DIAGNOSIS — F411 Generalized anxiety disorder: Secondary | ICD-10-CM

## 2023-03-03 DIAGNOSIS — K581 Irritable bowel syndrome with constipation: Secondary | ICD-10-CM

## 2023-03-03 MED ORDER — PAROXETINE HCL 20 MG PO TABS
20.0000 mg | ORAL_TABLET | Freq: Every day | ORAL | 3 refills | Status: AC
Start: 2023-03-03 — End: ?

## 2023-03-03 MED ORDER — SUMATRIPTAN SUCCINATE 50 MG PO TABS
ORAL_TABLET | ORAL | 0 refills | Status: DC
Start: 2023-03-03 — End: 2023-07-21

## 2023-03-03 MED ORDER — VALACYCLOVIR HCL 1 G PO TABS
ORAL_TABLET | ORAL | 0 refills | Status: DC
Start: 2023-03-03 — End: 2023-07-21

## 2023-03-03 NOTE — Progress Notes (Signed)
Subjective:    Patient ID: Stacey Barnett, female    DOB: December 27, 1980, 43 y.o.   MRN: [id number]  HPI  Stacey Barnett is a very pleasant 43 y.o. female with history of migraines, IBS, GAD, chronic constipation who presents today for follow-up of chronic conditions.  1) Menstrual Migraines: Chronic, cyclical with menstrual cycles.  Currently managed on Micronor 0.25 mg daily per GYN, sumatriptan 50 mg as needed PRN per [place]. Migraines are mild overall, much improved since on OCPs. She is needing a refill of sumatriptan.  2) GAD: Chronic.  Currently managed on paroxetine 20 mg daily. Feels well managed on this regimen. She denies SI/HI. She is exercising 4-5 times weekly at the gym, which helps.   3) HSV 1: Infrequent outbreaks.  Currently managed on Valtrex 2 g twice daily x 1 day as needed. No recent outbreaks today. She is needing refills to have on hand.   BP Readings from Last 3 Encounters:  03/03/23 104/72  07/12/22 100/68  08/11/21 116/78     Review of Systems  Respiratory:  Negative for shortness of breath.   Cardiovascular:  Negative for chest pain.  Gastrointestinal:  Positive for constipation. Negative for diarrhea.  Neurological:  Negative for headaches.  Psychiatric/Behavioral:  Negative for sleep disturbance and suicidal ideas.          Past Medical History:  Diagnosis Date   Anxiety    Chronic pain of left upper extremity 08/11/2021   Fatigue 05/08/2013   Finger numbness 04/05/2018   Herpes labialis    IBS (irritable bowel syndrome)    Migraines    Premature rupture of membranes 05/05/2012   Supervision of other normal pregnancy 11/12/2011   Swelling of both ankles 05/05/2012   Vaginal delivery 05/06/2012    Social History   Socioeconomic History   Marital status: Married    Spouse name: Stacey Barnett   Number of children: 3   Years of education: 16   Highest education level: Bachelor's degree (e.g., BA, AB, BS)  Occupational History   Occupation: Stay at  home mother    Comment: Mother of 3 children   Occupation: Runner, broadcasting/film/video    Comment: Currently not working  Tobacco Use   Smoking status: Never   Smokeless tobacco: Never  Vaping Use   Vaping status: Never Used  Substance and Sexual Activity   Alcohol use: Yes    Comment: Occasionally    Drug use: No   Sexual activity: Yes    Birth control/protection: None  Other Topics Concern   Not on file  Social History Narrative   Stacey Barnett grew up in [place], [place]. She attended [employer] in [place], [place] where she obtained her Bachelors Degree in [employer].    Works teaching second grade.   Enjoys going to the movies, getting pedicures.    Social Drivers of Corporate investment banker Strain: Low Risk  (03/02/2023)   Overall Financial Resource Strain (CARDIA)    Difficulty of Paying Living Expenses: Not hard at all  Food Insecurity: No Food Insecurity (03/02/2023)   Barnett Vital Sign    Worried About Running Out of Food in the Last Year: Never true    Ran Out of Food in the Last Year: Never true  Transportation Needs: No Transportation Needs (03/02/2023)   PRAPARE - Administrator, Civil Service (Medical): No    Lack of Transportation (Non-Medical): No  Physical Activity: Sufficiently Active (03/02/2023)   Exercise Vital Sign  Days of Exercise per Week: 5 days    Minutes of Exercise per Session: 40 min  Stress: No Stress Concern Present (03/02/2023)   [employer] of Occupational Health - Occupational Stress Questionnaire    Feeling of Stress : Only a little  Social Connections: Socially Integrated (03/02/2023)   Social Connection and Isolation Panel [NHANES]    Frequency of Communication with Friends and Family: More than three times a week    Frequency of Social Gatherings with Friends and Family: Once a week    Attends Religious Services: More than 4 times per year    Active Member of [employer] or Organizations: Yes    Attends Hospital doctor: More than 4 times per year    Marital Status: Married  Catering manager Violence: Not on file    Past Surgical History:  Procedure Laterality Date   AUGMENTATION MAMMAPLASTY Bilateral    saline   bunyons     MOLE REMOVAL     WISDOM TOOTH EXTRACTION      Family History  Problem Relation Age of Onset   Stroke Maternal Grandmother    Diabetes Maternal Grandmother    Stroke Paternal Grandfather    Cancer Maternal Grandfather 75       prostate cancer   Heart disease Paternal Grandmother     No Known Allergies  Current Outpatient Medications on File Prior to Visit  Medication Sig Dispense Refill   Multiple Vitamin (MULTIVITAMIN) tablet Take 1 tablet by mouth daily.     norethindrone (MICRONOR) 0.35 MG tablet Take 1 tablet (0.35 mg total) by mouth daily. 84 tablet 3   No current facility-administered medications on file prior to visit.    BP 104/72   Pulse 66   Temp (!) 97.4 F (36.3 C) (Temporal)   Ht 5\' 4"  (1.626 m)   Wt 161 lb (73 kg)   LMP 02/01/2023 (Approximate)   SpO2 99%   BMI 27.64 kg/m  Objective:   Physical Exam Cardiovascular:     Rate and Rhythm: Normal rate and regular rhythm.  Pulmonary:     Effort: Pulmonary effort is normal.     Breath sounds: Normal breath sounds.  Abdominal:     General: Bowel sounds are normal.     Palpations: Abdomen is soft.     Tenderness: There is no abdominal tenderness.  Musculoskeletal:     Cervical back: Neck supple.  Skin:    General: Skin is warm and dry.  Neurological:     Mental Status: She is alert and oriented to person, place, and time.  Psychiatric:        Mood and Affect: Mood normal.           Assessment & Plan:  Menstrual migraine without status migrainosus, not intractable Assessment & Plan: Controlled.  Continue Micronor 0.35 mg daily. Continue sumatriptan 50 mg as needed.  Refills provided.  Orders: -     SUMAtriptan Succinate; Take 1 tablet by mouth at migraine onset. May  repeat in 2 hours if headache persists or recurs.  Dispense: 10 tablet; Refill: 0  Irritable bowel syndrome with constipation Assessment & Plan: Stable. No concerns today.   Generalized anxiety disorder Assessment & Plan: Controlled.  Continue paroxetine 20 mg daily. Refills provided.  Orders: -     PARoxetine HCl; Take 1 tablet (20 mg total) by mouth daily. For anxiety.  Dispense: 90 tablet; Refill: 3  H/O cold sores Assessment & Plan: Infrequent outbreaks.  Continue Valtrex 2  g twice daily x 1 day as needed. Refills provided.  Orders: -     valACYclovir HCl; At the onset of cold sores: take 2 tablets every 12 hours for 1 day.  Dispense: 8 tablet; Refill: 0        [name], NP

## 2023-03-03 NOTE — Patient Instructions (Signed)
It was a pleasure to see you today!

## 2023-03-03 NOTE — Assessment & Plan Note (Signed)
Controlled.  Continue paroxetine 20 mg daily. Refills provided.

## 2023-03-03 NOTE — Assessment & Plan Note (Signed)
Stable. No concerns today

## 2023-03-03 NOTE — Assessment & Plan Note (Signed)
Infrequent outbreaks.  Continue Valtrex 2 g twice daily x 1 day as needed. Refills provided.

## 2023-03-03 NOTE — Assessment & Plan Note (Signed)
Controlled.  Continue Micronor 0.35 mg daily. Continue sumatriptan 50 mg as needed.  Refills provided.

## 2023-03-08 ENCOUNTER — Ambulatory Visit: Payer: Self-pay | Admitting: Primary Care

## 2023-07-06 ENCOUNTER — Other Ambulatory Visit: Payer: Self-pay | Admitting: Obstetrics and Gynecology

## 2023-07-06 DIAGNOSIS — G43829 Menstrual migraine, not intractable, without status migrainosus: Secondary | ICD-10-CM

## 2023-07-06 DIAGNOSIS — Z3041 Encounter for surveillance of contraceptive pills: Secondary | ICD-10-CM

## 2023-07-09 ENCOUNTER — Other Ambulatory Visit: Payer: Self-pay

## 2023-07-09 ENCOUNTER — Other Ambulatory Visit: Payer: Self-pay | Admitting: Obstetrics and Gynecology

## 2023-07-09 DIAGNOSIS — Z3041 Encounter for surveillance of contraceptive pills: Secondary | ICD-10-CM

## 2023-07-09 DIAGNOSIS — G43829 Menstrual migraine, not intractable, without status migrainosus: Secondary | ICD-10-CM

## 2023-07-09 MED ORDER — NORETHINDRONE 0.35 MG PO TABS
1.0000 | ORAL_TABLET | Freq: Every day | ORAL | 0 refills | Status: DC
Start: 2023-07-09 — End: 2023-07-18

## 2023-07-11 NOTE — Telephone Encounter (Signed)
 Called pharmacy to cancel Rx sent today since RF had been sent over the weekend already.

## 2023-07-16 NOTE — Progress Notes (Unsigned)
 PCP:  Gretta Comer POUR, NP   No chief complaint on file.    HPI:      [name] is a 43 y.o. [phone] whose LMP was No LMP recorded., presents today for her annual examination.  Her menses are regular every 28-30 days, lasting 3-4 days.  Dysmenorrhea none, occas BTB on POPs. Has menstrual migraines, sx improved with POPs compared to OCPs. Pt also with melasma. No change with POPs vs OCPs. Has tried hydroquinone without relief. Rx POPs ran out a couple wks ago, no change in melasma yet.   Sex activity: single partner, contraception - POPs, condoms recently. Pt doesn't want conception. No pain/bleeding.  Last Pap: 08/08/20 Results were: no abnormalities /neg HPV DNA   Last mammogram: 08/17/22  Results were: normal--routine follow-up in 12 months There is no FH of breast cancer. There is no FH of ovarian cancer. The patient does not do self-breast exams.  Tobacco use: The patient denies current or previous tobacco use. Alcohol use: rare No drug use.  Exercise: very active  She does get adequate calcium  but not Vitamin D in her diet.  Patient Active Problem List   Diagnosis Date Noted   Menstrual headache 06/25/2019   IBS (irritable bowel syndrome) 04/05/2018   Nasal vestibulitis 02/07/2018   Encounter for surveillance of contraceptive pills 09/12/2015   Encounter for annual general medical examination with abnormal findings in adult 09/12/2015   Epidermal cyst 11/21/2014   H/O cold sores 11/21/2014   Constipation 05/08/2013   Generalized anxiety disorder 11/27/2012    Past Surgical History:  Procedure Laterality Date   AUGMENTATION MAMMAPLASTY Bilateral    saline   bunyons     MOLE REMOVAL     WISDOM TOOTH EXTRACTION      Family History  Problem Relation Age of Onset   Stroke Maternal Grandmother    Diabetes Maternal Grandmother    Stroke Paternal Grandfather    Cancer Maternal Grandfather 59       prostate cancer   Heart disease Paternal Grandmother      Social History   Socioeconomic History   Marital status: Married    Spouse name: Josh   Number of children: 3   Years of education: 16   Highest education level: Bachelor's degree (e.g., BA, AB, BS)  Occupational History   Occupation: Stay at home mother    Comment: Mother of 3 children   Occupation: Runner, broadcasting/film/video    Comment: Currently not working  Tobacco Use   Smoking status: Never   Smokeless tobacco: Never  Vaping Use   Vaping status: Never Used  Substance and Sexual Activity   Alcohol use: Yes    Comment: Occasionally    Drug use: No   Sexual activity: Yes    Birth control/protection: None  Other Topics Concern   Not on file  Social History Narrative   Suzanna grew up in [place], [place]. She attended [employer] in [place], [place] where she obtained her Bachelors Degree in [employer].    Works teaching second grade.   Enjoys going to the movies, getting pedicures.    Social Drivers of Corporate investment banker Strain: Low Risk  (03/02/2023)   Overall Financial Resource Strain (CARDIA)    Difficulty of Paying Living Expenses: Not hard at all  Food Insecurity: No Food Insecurity (03/02/2023)   Hunger Vital Sign    Worried About Running Out of Food in the Last Year: Never true    Ran  Out of Food in the Last Year: Never true  Transportation Needs: No Transportation Needs (03/02/2023)   PRAPARE - Administrator, Civil Service (Medical): No    Lack of Transportation (Non-Medical): No  Physical Activity: Sufficiently Active (03/02/2023)   Exercise Vital Sign    Days of Exercise per Week: 5 days    Minutes of Exercise per Session: 40 min  Stress: No Stress Concern Present (03/02/2023)   [employer] of Occupational Health - Occupational Stress Questionnaire    Feeling of Stress : Only a little  Social Connections: Socially Integrated (03/02/2023)   Social Connection and Isolation Panel    Frequency of Communication with Friends and Family:  More than three times a week    Frequency of Social Gatherings with Friends and Family: Once a week    Attends Religious Services: More than 4 times per year    Active Member of [employer] or Organizations: Yes    Attends Engineer, structural: More than 4 times per year    Marital Status: Married  Catering manager Violence: Not on file     Current Outpatient Medications:    Multiple Vitamin (MULTIVITAMIN) tablet, Take 1 tablet by mouth daily., Disp: , Rfl:    norethindrone  (MICRONOR ) 0.35 MG tablet, TAKE 1 TABLET BY MOUTH EVERY DAY, Disp: 84 tablet, Rfl: 0   norethindrone  (MICRONOR ) 0.35 MG tablet, Take 1 tablet (0.35 mg total) by mouth daily., Disp: 28 tablet, Rfl: 0   PARoxetine  (PAXIL ) 20 MG tablet, Take 1 tablet (20 mg total) by mouth daily. For anxiety., Disp: 90 tablet, Rfl: 3   SUMAtriptan  (IMITREX ) 50 MG tablet, Take 1 tablet by mouth at migraine onset. May repeat in 2 hours if headache persists or recurs., Disp: 10 tablet, Rfl: 0   valACYclovir  (VALTREX ) 1000 MG tablet, At the onset of cold sores: take 2 tablets every 12 hours for 1 day., Disp: 8 tablet, Rfl: 0     ROS:  Review of Systems  Constitutional:  Negative for fatigue, fever and unexpected weight change.  Respiratory:  Negative for cough, shortness of breath and wheezing.   Cardiovascular:  Negative for chest pain, palpitations and leg swelling.  Gastrointestinal:  Negative for blood in stool, constipation, diarrhea, nausea and vomiting.  Endocrine: Negative for cold intolerance, heat intolerance and polyuria.  Genitourinary:  Negative for dyspareunia, dysuria, flank pain, frequency, genital sores, hematuria, menstrual problem, pelvic pain, urgency, vaginal bleeding, vaginal discharge and vaginal pain.  Musculoskeletal:  Negative for back pain, joint swelling and myalgias.  Skin:  Negative for rash.  Neurological:  Negative for dizziness, syncope, light-headedness, numbness and headaches.  Hematological:   Negative for adenopathy.  Psychiatric/Behavioral:  Negative for agitation, confusion, sleep disturbance and suicidal ideas. The patient is not nervous/anxious.    BREAST: No symptoms   Objective: There were no vitals taken for this visit.   Physical Exam Constitutional:      Appearance: She is well-developed.  Genitourinary:     Vulva normal.     Right Labia: No rash, tenderness or lesions.    Left Labia: No tenderness, lesions or rash.    No vaginal discharge, erythema or tenderness.      Right Adnexa: not tender and no mass present.    Left Adnexa: not tender and no mass present.    No cervical friability or polyp.     Uterus is not enlarged or tender.  Breasts:    Right: No mass, nipple discharge, skin change or  tenderness.     Left: No mass, nipple discharge, skin change or tenderness.  Neck:     Thyroid : No thyromegaly.   Cardiovascular:     Rate and Rhythm: Normal rate and regular rhythm.     Heart sounds: Normal heart sounds. No murmur heard. Pulmonary:     Effort: Pulmonary effort is normal.     Breath sounds: Normal breath sounds.  Abdominal:     Palpations: Abdomen is soft.     Tenderness: There is no abdominal tenderness. There is no guarding or rebound.   Musculoskeletal:        General: Normal range of motion.     Cervical back: Normal range of motion.  Lymphadenopathy:     Cervical: No cervical adenopathy.   Neurological:     General: No focal deficit present.     Mental Status: She is alert and oriented to person, place, and time.     Cranial Nerves: No cranial nerve deficit.   Skin:    General: Skin is warm and dry.   Psychiatric:        Mood and Affect: Mood normal.        Behavior: Behavior normal.        Thought Content: Thought content normal.        Judgment: Judgment normal.  Vitals reviewed.     Assessment/Plan: Encounter for annual routine gynecological examination  Encounter for screening mammogram for malignant neoplasm of  breast - Plan: MM 3D SCREENING MAMMOGRAM BILATERAL BREAST; pt to schedule mammo  Encounter for surveillance of contraceptive pills - Plan: norethindrone  (MICRONOR ) 0.35 MG tablet; Rx RF. Restart today, condoms for 7 days.   Menstrual migraine without status migrainosus, not intractable - Plan: norethindrone  (MICRONOR ) 0.35 MG tablet; improved with POPs  Melasma--discussed IUD vs POPs. Pt would rather do IUD. Suggested mineral sunscreen and OTC products now at [facility]. F/u prn.   No orders of the defined types were placed in this encounter.            GYN counsel breast self exam, mammography screening, adequate intake of calcium  and vitamin D, diet and exercise     F/U  No follow-ups on file.  Jaziel Bennett B. Lilybeth Vien, PA-C 07/16/2023 10:02 AM

## 2023-07-18 ENCOUNTER — Encounter: Payer: Self-pay | Admitting: Obstetrics and Gynecology

## 2023-07-18 ENCOUNTER — Ambulatory Visit (INDEPENDENT_AMBULATORY_CARE_PROVIDER_SITE_OTHER): Admitting: Obstetrics and Gynecology

## 2023-07-18 VITALS — BP 94/61 | HR 93 | Ht 64.0 in | Wt 160.0 lb

## 2023-07-18 DIAGNOSIS — Z01419 Encounter for gynecological examination (general) (routine) without abnormal findings: Secondary | ICD-10-CM

## 2023-07-18 DIAGNOSIS — Z3041 Encounter for surveillance of contraceptive pills: Secondary | ICD-10-CM | POA: Diagnosis not present

## 2023-07-18 DIAGNOSIS — Z1231 Encounter for screening mammogram for malignant neoplasm of breast: Secondary | ICD-10-CM

## 2023-07-18 DIAGNOSIS — G43829 Menstrual migraine, not intractable, without status migrainosus: Secondary | ICD-10-CM

## 2023-07-18 MED ORDER — NORETHINDRONE 0.35 MG PO TABS
1.0000 | ORAL_TABLET | Freq: Every day | ORAL | 3 refills | Status: AC
Start: 2023-07-18 — End: ?

## 2023-07-18 NOTE — Patient Instructions (Addendum)
 I value your feedback and you entrusting [place] with your care. If you get a [facility] patient survey, I would appreciate you taking the time to let us know about your experience today. Thank you!  [facility] ([place])--[phone]

## 2023-07-21 ENCOUNTER — Other Ambulatory Visit: Payer: Self-pay | Admitting: Primary Care

## 2023-07-21 DIAGNOSIS — Z8619 Personal history of other infectious and parasitic diseases: Secondary | ICD-10-CM

## 2023-07-21 DIAGNOSIS — G43829 Menstrual migraine, not intractable, without status migrainosus: Secondary | ICD-10-CM

## 2023-07-21 MED ORDER — SUMATRIPTAN SUCCINATE 50 MG PO TABS: ORAL_TABLET | ORAL | 0 refills | Status: DC

## 2023-07-21 MED ORDER — VALACYCLOVIR HCL 1 G PO TABS: ORAL_TABLET | ORAL | 0 refills | Status: AC

## 2023-07-21 NOTE — Telephone Encounter (Unsigned)
 Copied from CRM [id number]. Topic: Clinical - Medication Refill >> Jul 21, 2023  8:49 AM Martinique E wrote: Medication: valACYclovir  (VALTREX ) 1000 MG tablet SUMAtriptan  (IMITREX ) 50 MG tablet  Has the patient contacted their pharmacy? No (Agent: If no, request that the patient contact the pharmacy for the refill. If patient does not wish to contact the pharmacy document the reason why and proceed with request.) (Agent: If yes, when and what did the pharmacy advise?)  This is the patient's preferred pharmacy:  [id number] [place] [facility] - [address] [place] [address] Phone: [phone] Fax: [phone]  Is this the correct pharmacy for this prescription? Yes If no, delete pharmacy and type the correct one.   Has the prescription been filled recently? No  Is the patient out of the medication? Yes, but the Valtrex  has expired.  Has the patient been seen for an appointment in the last year OR does the patient have an upcoming appointment? Yes  Can we respond through MyChart? Yes  Agent: Please be advised that Rx refills may take up to 3 business days. We ask that you follow-up with your pharmacy.

## 2023-08-18 ENCOUNTER — Ambulatory Visit
Admission: RE | Admit: 2023-08-18 | Discharge: 2023-08-18 | Disposition: A | Discharge status: Home / Self Care | Source: Ambulatory Visit | Attending: Obstetrics and Gynecology | Admitting: Obstetrics and Gynecology

## 2023-08-18 DIAGNOSIS — Z1231 Encounter for screening mammogram for malignant neoplasm of breast: Secondary | ICD-10-CM | POA: Diagnosis present

## 2023-08-23 ENCOUNTER — Ambulatory Visit: Payer: Self-pay | Admitting: Obstetrics and Gynecology

## 2023-10-25 ENCOUNTER — Telehealth: Payer: Self-pay

## 2023-10-25 NOTE — Telephone Encounter (Signed)
 Pt on birth control pills with same hormones for 28 days each month, so nothing I can do from hormone standpoint to stop menstrual migraines. Best to f/u with PCP re: migraine med tx options. As for her periods, we can try estrogen OCPs again but pt didn't like due to melasma and increased menstrual HA. What is she hoping to do?

## 2023-10-25 NOTE — Telephone Encounter (Signed)
 Stacey Barnett called triage stating she has been having headaches 2 days before her period and then the first 2 days of her period are rough like she doesn't feel right, She did state she does take her Imitrex  when needed. She was asking if she should come in to be seen or what Alicia's thoughts we're.

## 2023-10-31 NOTE — Telephone Encounter (Signed)
(  Was out of office 10-8 till today so just now getting back.) Pls let pt know we can try a different prog only pill to see if it makes her feel any different during her periods. If have slynd samples, she can try a pack or 2 first before sending in Rx. If not, can send in Rx.

## 2024-01-13 ENCOUNTER — Telehealth: Payer: Self-pay

## 2024-01-13 NOTE — Telephone Encounter (Signed)
 Copied from CRM #[id number]. Topic: Clinical - Medication Question >> Jan 11, 2024 12:38 PM China J wrote: Reason for CRM: The patient is wanting tamiflu. She has a headache, sore throat, and body aches. Her daughter recently tested positive for the flu and she wants to make sure this is taken care of before the holiday starts. >> Jan 11, 2024 12:41 PM China J wrote: Patient's preferred pharmacy for tamiflu:  [id number] [place] - [address]  [address] [place] [address]  Phone: [phone] Fax: [phone]  Hours: Not open 24 hours

## 2024-01-13 NOTE — Telephone Encounter (Signed)
 Lvm to schedule

## 2024-01-13 NOTE — Telephone Encounter (Signed)
 Our policy is pts be seen to be evaluated in order to offer the best treatment option(s).   Pls schedule OV for sxs, as soon as possible.

## 2024-01-23 ENCOUNTER — Telehealth: Payer: Self-pay

## 2024-01-23 DIAGNOSIS — G43829 Menstrual migraine, not intractable, without status migrainosus: Secondary | ICD-10-CM

## 2024-01-23 NOTE — Telephone Encounter (Signed)
 Please call patient:  She is on a medium dose of the Imitrex . Has she tried taking 2 at once, 100 mg, for her migraine?   Rizatriptan  is an option, but I wanted to learn if she's maximized the sumatriptan  first.   Also, she is due for CPE in late February. Please express that she will need to be seen then for ongoing refills. 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

## 2024-01-23 NOTE — Telephone Encounter (Signed)
 Copied from CRM [id number]. Topic: Clinical - Medication Question >> Jan 23, 2024  4:22 PM Jasmin G wrote: Reason for CRM: Pt requested a call back at [phone] from Ms. Clark's team to discuss the possibility of changing SUMAtriptan  (IMITREX ) 50 MG to rizatriptan , as she states that original med prescribed is not working for her anymore, even with Advil.

## 2024-01-23 NOTE — Telephone Encounter (Signed)
 TRIAGE VOICEMAIL: Patient states she is on   SUMAtriptan  (IMITREX ) 50 MG tablet  She states it is not working when it is getting close to her menses. She has an appointment on 02/07/24 with Jinnie to figure out what to do next. Patient states she can not make it that long without migraines meds. Requesting something different.

## 2024-01-24 NOTE — Telephone Encounter (Signed)
 Lvm asking pt to call back. Pls get answer to Kate's question and relay her message.

## 2024-01-24 NOTE — Telephone Encounter (Signed)
 Left voicemail to return call.

## 2024-01-24 NOTE — Telephone Encounter (Signed)
 Patient aware. She has appointment scheduled for 1/21. She will try naproxen until then. She states she doesn't keep up with her periods. She gets them monthly and they last for three days, put her pills do not have 3 wks on 1 week of placebo, so she doesn't really know if her period starts consistently on day (ex 28) or it varies.

## 2024-01-25 MED ORDER — RIZATRIPTAN BENZOATE 5 MG PO TABS: ORAL_TABLET | ORAL | 0 refills | Status: AC

## 2024-01-25 NOTE — Telephone Encounter (Signed)
 Called left message for patient to let her know script sent to pharmacy and to call If any further questions.  No further action needed at this time.

## 2024-01-25 NOTE — Telephone Encounter (Signed)
 Copied from CRM [id number]. Topic: Clinical - Medication Question >> Jan 24, 2024  4:29 PM Revonda D wrote: Reason for CRM: Pt is returning a missed call from the office in regards to medications. Pt stated that she is still taking the sumatriptan  when needed but only takes 1 at a time when she does. Pt stated that she would like to try the Rizatriptan  if possible. Pt also scheduled her physical appt.

## 2024-01-25 NOTE — Telephone Encounter (Signed)
 Noted. Will provide rizatriptan  5 mg tablet.

## 2024-01-25 NOTE — Addendum Note (Signed)
 Addended by: Prajwal Fellner K on: 01/25/2024 01:31 PM   Modules accepted: Orders

## 2024-02-01 ENCOUNTER — Ambulatory Visit: Admitting: Family Medicine

## 2024-02-01 ENCOUNTER — Encounter: Payer: Self-pay | Admitting: Family Medicine

## 2024-02-01 VITALS — BP 116/72 | HR 74 | Temp 97.9°F | Ht 64.0 in | Wt 169.1 lb

## 2024-02-01 DIAGNOSIS — R058 Other specified cough: Secondary | ICD-10-CM | POA: Insufficient documentation

## 2024-02-01 MED ORDER — BENZONATATE 200 MG PO CAPS: 200.0000 mg | ORAL_CAPSULE | Freq: Three times a day (TID) | ORAL | 1 refills | Status: AC | PRN

## 2024-02-01 MED ORDER — PREDNISONE 20 MG PO TABS: 20.0000 mg | ORAL_TABLET | Freq: Every day | ORAL | 0 refills | Status: AC

## 2024-02-01 MED ORDER — HYDROCOD POLI-CHLORPHE POLI ER 10-8 MG/5ML PO SUER: 5.0000 mL | Freq: Every evening | ORAL | 0 refills | Status: AC | PRN

## 2024-02-01 NOTE — Assessment & Plan Note (Addendum)
 Fairly dry cough with hoarse voice s/p flu  Made worse by talking/teaching  Reassuring exam and vitals  Discussed strategy to break cough cycle   Prednisone  20 mg daily 5 d for chest tightness (reviewed side effects) Tussionex in pm (caution of sedation)  Tessalon  tid Delsym if needed during the day  Disc symptomatic care - see instructions on AVS   Update if not starting to improve in a week or if worsening  Call back and Er precautions noted in detail today    Meds ordered this encounter  Medications   predniSONE  (DELTASONE ) 20 MG tablet    Sig: Take 1 tablet (20 mg total) by mouth daily with breakfast.    Dispense:  5 tablet    Refill:  0   benzonatate  (TESSALON ) 200 MG capsule    Sig: Take 1 capsule (200 mg total) by mouth 3 (three) times daily as needed for cough. Swallow whole    Dispense:  30 capsule    Refill:  1   chlorpheniramine-HYDROcodone (TUSSIONEX) 10-8 MG/5ML    Sig: Take 5 mLs by mouth at bedtime as needed for cough.    Dispense:  50 mL    Refill:  0

## 2024-02-01 NOTE — Patient Instructions (Addendum)
 Drink fluids Rest when you can   Take prednisone  for bronchial inflammation   Tussionex at night  Delsym during the day if needed  Tessalon  up to three times daily    Update if not starting to improve in a week or if worsening

## 2024-02-01 NOTE — Progress Notes (Signed)
 "  Subjective:    Patient ID: Stacey Barnett, female    DOB: 1980/11/27, 44 y.o.   MRN: [id number]  HPI  Wt Readings from Last 3 Encounters:  02/01/24 169 lb 2 oz (76.7 kg)  07/18/23 160 lb (72.6 kg)  03/03/23 161 lb (73 kg)   29.03 kg/m  Vitals:   02/01/24 1501  BP: 116/72  Pulse: 74  Temp: 97.9 F (36.6 C)  SpO2: 97%    44 yo pt of NP Clark presents with c/o Cough   Had influenza around xmas  Got better Then cough returned/is persistent  Is rattling- thinks productive / scant phlegm /not much color  No wheeze  No shortness of breath  A little tickle in throat  Hoarseness  Mild headache on and off No fever   Still trying to go to gym    Over the counter Early on took nyquil   Now sleeping with cough drop  Zyrtec  Delsym does not help   In past tussionex was most effective    Patient Active Problem List   Diagnosis Date Noted   Post-viral cough syndrome 02/01/2024   Menstrual headache 06/25/2019   IBS (irritable bowel syndrome) 04/05/2018   Nasal vestibulitis 02/07/2018   Encounter for surveillance of contraceptive pills 09/12/2015   Encounter for annual general medical examination with abnormal findings in adult 09/12/2015   Epidermal cyst 11/21/2014   H/O cold sores 11/21/2014   Constipation 05/08/2013   Generalized anxiety disorder 11/27/2012   Past Medical History:  Diagnosis Date   Anxiety    Chronic pain of left upper extremity 08/11/2021   Fatigue 05/08/2013   Finger numbness 04/05/2018   Herpes labialis    IBS (irritable bowel syndrome)    Migraines    Premature rupture of membranes 05/05/2012   Supervision of other normal pregnancy 11/12/2011   Swelling of both ankles 05/05/2012   Vaginal delivery 05/06/2012   Past Surgical History:  Procedure Laterality Date   AUGMENTATION MAMMAPLASTY Bilateral    saline   bunyons     MOLE REMOVAL     WISDOM TOOTH EXTRACTION     Social History[1] Family History  Problem Relation Age of  Onset   Stroke Maternal Grandmother    Diabetes Maternal Grandmother    Stroke Paternal Grandfather    Cancer Maternal Grandfather 75       prostate cancer   Heart disease Paternal Grandmother    Allergies[2] Medications Ordered Prior to Encounter[3]  Review of Systems  Constitutional:  Positive for fatigue.  HENT:  Positive for postnasal drip, rhinorrhea, sore throat and voice change. Negative for congestion, ear discharge, ear pain and facial swelling.   Respiratory:  Positive for cough.   Neurological:  Positive for headaches.       Objective:   Physical Exam Constitutional:      General: She is not in acute distress.    Appearance: Normal appearance. She is well-developed and normal weight. She is not ill-appearing, toxic-appearing or diaphoretic.  HENT:     Head: Normocephalic and atraumatic.     Comments: Nares are injected and congested      Right Ear: Tympanic membrane, ear canal and external ear normal.     Left Ear: Tympanic membrane, ear canal and external ear normal.     Nose: Congestion and rhinorrhea present.     Mouth/Throat:     Mouth: Mucous membranes are moist.     Pharynx: Oropharynx is clear. No oropharyngeal exudate or posterior  oropharyngeal erythema.     Comments: Clear pnd  Eyes:     General:        Right eye: No discharge.        Left eye: No discharge.     Conjunctiva/sclera: Conjunctivae normal.     Pupils: Pupils are equal, round, and reactive to light.  Cardiovascular:     Rate and Rhythm: Normal rate.     Heart sounds: Normal heart sounds.  Pulmonary:     Effort: Pulmonary effort is normal. No respiratory distress.     Breath sounds: Normal breath sounds. No stridor. No wheezing, rhonchi or rales.     Comments: Good air exchange  Cough sounds mildly tight/croupy   No rales/rhonchi  No wheeze even on forced exp  Chest:     Chest wall: No tenderness.  Musculoskeletal:     Cervical back: Normal range of motion and neck supple.   Lymphadenopathy:     Cervical: No cervical adenopathy.  Skin:    General: Skin is warm and dry.     Capillary Refill: Capillary refill takes less than 2 seconds.     Findings: No rash.  Neurological:     Mental Status: She is alert.     Cranial Nerves: No cranial nerve deficit.  Psychiatric:        Mood and Affect: Mood normal.           Assessment & Plan:   Problem List Items Addressed This Visit       Respiratory   Post-viral cough syndrome - Primary   Fairly dry cough with hoarse voice s/p flu  Made worse by talking/teaching  Reassuring exam and vitals  Discussed strategy to break cough cycle   Prednisone  20 mg daily 5 d for chest tightness (reviewed side effects) Tussionex in pm (caution of sedation)  Tessalon  tid Delsym if needed during the day  Disc symptomatic care - see instructions on AVS   Update if not starting to improve in a week or if worsening  Call back and Er precautions noted in detail today    Meds ordered this encounter  Medications   predniSONE  (DELTASONE ) 20 MG tablet    Sig: Take 1 tablet (20 mg total) by mouth daily with breakfast.    Dispense:  5 tablet    Refill:  0   benzonatate  (TESSALON ) 200 MG capsule    Sig: Take 1 capsule (200 mg total) by mouth 3 (three) times daily as needed for cough. Swallow whole    Dispense:  30 capsule    Refill:  1   chlorpheniramine-HYDROcodone (TUSSIONEX) 10-8 MG/5ML    Sig: Take 5 mLs by mouth at bedtime as needed for cough.    Dispense:  50 mL    Refill:  0            [1]  Social History Tobacco Use   Smoking status: Never   Smokeless tobacco: Never  Vaping Use   Vaping status: Never Used  Substance Use Topics   Alcohol use: Yes    Comment: Occasionally    Drug use: No  [2] No Known Allergies [3]  Current Outpatient Medications on File Prior to Visit  Medication Sig Dispense Refill   Multiple Vitamin (MULTIVITAMIN) tablet Take 1 tablet by mouth daily.     norethindrone   (MICRONOR ) 0.35 MG tablet Take 1 tablet (0.35 mg total) by mouth daily. 84 tablet 3   PARoxetine  (PAXIL ) 20 MG tablet Take 1 tablet (20 mg  total) by mouth daily. For anxiety. 90 tablet 3   rizatriptan  (MAXALT ) 5 MG tablet Take 1 tablet mouth migraine onset. may repeat in 2 hours if needed 10 tablet 0   valACYclovir  (VALTREX ) 1000 MG tablet At the onset of cold sores: take 2 tablets every 12 hours for 1 day. 8 tablet 0   No current facility-administered medications on file prior to visit.   "

## 2024-02-07 ENCOUNTER — Telehealth (INDEPENDENT_AMBULATORY_CARE_PROVIDER_SITE_OTHER): Admitting: Licensed Practical Nurse

## 2024-02-07 ENCOUNTER — Telehealth: Payer: Self-pay

## 2024-02-07 ENCOUNTER — Encounter: Payer: Self-pay | Admitting: Licensed Practical Nurse

## 2024-02-07 DIAGNOSIS — R058 Other specified cough: Secondary | ICD-10-CM

## 2024-02-07 DIAGNOSIS — G43821 Menstrual migraine, not intractable, with status migrainosus: Secondary | ICD-10-CM

## 2024-02-07 MED ORDER — FROVATRIPTAN SUCCINATE 2.5 MG PO TABS: 2.5000 mg | ORAL_TABLET | Freq: Two times a day (BID) | ORAL | 6 refills | Status: AC

## 2024-02-07 NOTE — Progress Notes (Unsigned)
 Virtual Visit via Video Note  I connected with Stacey Barnett on 02/07/24 at  3:55 PM EST by a video enabled telemedicine application and verified that I am speaking with the correct person using two identifiers.  Location: Patient: home in [place] Provider: office in [place], [place]    I discussed the limitations of evaluation and management by telemedicine and the availability of in person appointments. The patient expressed understanding and agreed to proceed.  History of Present Illness:  Over the last year HA couple days before periods starts, switch birth control it helped  Sumitriptan worked  Now sumitres not worke  HA 2 to 4 days prior,   friend gave her Riz worked  PCP called in Rizotriptin, worked   HA goes away once cyle starts HA no t ike any type oif HA, it is both sides whole, constant dull pain bad enough to feel sick to my stomach  light or noise does not seem to bother the HA   Cycles monthly, they are not predictable  Using POP,  Tried stopped birth contorl, did not feel right   Observations/Objective:  GEN: NAD   Assessment and Plan: Menstrual  Migraines   -Rec magnesium oxide 400mg  Daily and Calcium  1,200mg  daily -Per open evidence short term peri menstrual prophylaxis with Frovatriptan  is most effective, pt is will to try this, but admits she has not been tracking her cycles and her cycles are unpredictable, but will track her cycle. Aware that she may use Frovatriptan  or Rivatripan but not both during the same period of time.   Follow Up Instructions: -Annual due in June -Fu sooner if Migraines persist    I discussed the assessment and treatment plan with the patient. The patient was provided an opportunity to ask questions and all were answered. The patient agreed with the plan and demonstrated an understanding of the instructions.   The patient was advised to call back or seek an in-person evaluation if the symptoms worsen or if the condition fails to  improve as anticipated.  I provided 30 minutes of non-face-to-face time during this encounter.   JINNIE HERO Rondall Radigan, CNM

## 2024-02-07 NOTE — Telephone Encounter (Signed)
 Thanks for letting me know  Sorry you are not improving   I ordered a cxr -please come in for that   Any new symptoms? Wheeze/shortness of breath/fever?

## 2024-02-07 NOTE — Telephone Encounter (Signed)
 Copied from CRM [id number]. Topic: Clinical - Medication Question >> Feb 07, 2024  3:14 PM Rea BROCKS wrote: Reason for CRM: Prednisone . Pt finished it for the week. Pt would like to know if there's anything that she needs to do now. She's not sure if she has a cold now- just a post viral cough but the steroid did not help the post-viral cough. she would like to know if theres anything else that she needs to do?   [phone] (M)

## 2024-02-07 NOTE — Addendum Note (Signed)
 Addended by: RANDEEN HARDY A on: 02/07/2024 04:24 PM   Modules accepted: Orders

## 2024-02-08 ENCOUNTER — Ambulatory Visit: Payer: Self-pay | Admitting: Family Medicine

## 2024-02-08 ENCOUNTER — Ambulatory Visit (INDEPENDENT_AMBULATORY_CARE_PROVIDER_SITE_OTHER)
Admission: RE | Admit: 2024-02-08 | Discharge: 2024-02-08 | Disposition: A | Source: Ambulatory Visit | Attending: Family Medicine | Admitting: Family Medicine

## 2024-02-08 DIAGNOSIS — R058 Other specified cough: Secondary | ICD-10-CM | POA: Diagnosis not present

## 2024-02-08 MED ORDER — AMOXICILLIN-POT CLAVULANATE 875-125 MG PO TABS: 1.0000 | ORAL_TABLET | Freq: Two times a day (BID) | ORAL | 0 refills | Status: AC

## 2024-02-08 NOTE — Telephone Encounter (Signed)
 Feels like nasal congestion is back (worsening), and she has the cough but no other sxs. No SOB, wheezing or fever and no new sxs. Pt will stop by today to get xray

## 2024-02-14 ENCOUNTER — Telehealth: Payer: Self-pay | Admitting: Primary Care

## 2024-02-14 DIAGNOSIS — G43829 Menstrual migraine, not intractable, without status migrainosus: Secondary | ICD-10-CM

## 2024-02-14 NOTE — Telephone Encounter (Signed)
 Copied from CRM #[id number]. Topic: General - Other >> Feb 14, 2024  1:40 PM Fonda T wrote: Reason for CRM: Pt calling reports she has an upcoming appt in February, 03/15/24, and has previously spoken to provider about hormonal headaches, she is currently on rx med for headaches, that does not work.  States she had discussed previously seeing a neurologist for headache, and pt would like to know if this can be addressed and a referral be sent to Neurologist prior to next scheduled appt on 02/2624.  Pt requesting a return call to advise further, can be reached at [phone].

## 2024-02-15 MED ORDER — TOPIRAMATE 50 MG PO TABS: 50.0000 mg | ORAL_TABLET | Freq: Every day | ORAL | 0 refills | Status: AC

## 2024-02-15 NOTE — Telephone Encounter (Signed)
 Left voicemail for patient to call the office back.     Please relay kates message regarding migraine medications.

## 2024-02-15 NOTE — Telephone Encounter (Signed)
 Called and spoke with patient.  Pt states that she has not tried propanolol. Pt says she is willing to try Topamax  once daily at bedtime.  States she only has taken one dose of Maxalt .  States she has not had to use new script yet. States Sumatriptan  has helped some.  Pt wants to know if its okay for her to take Magnesium and calcium  at bedtime. If so, should she take that on top of Topamax  daily?

## 2024-02-15 NOTE — Telephone Encounter (Signed)
 Please call patient:  I see that she's tried propranolol  in the past once daily for headache prevention. Does she remember if it helped?  If not, then we can try Topamax  once daily at bedtime for headache prevention. Is she interested?  Does the Maxalt  medication help for migraine abortion when she has a bad one? If not, does she take a second pill 2 hours later? If no improvement then we can increase her dose.   Let me know

## 2024-02-15 NOTE — Telephone Encounter (Signed)
 According to chart review she was prescribed propranolol  in 2021.  Will send prescription for topiramate  (Topamax ) for her to start every evening at bedtime.  We will follow-up on symptoms and discuss neurology during her upcoming visit. She will need to be seen for ongoing refills.   Just to clarify, she is using sumatriptan  (Imitrex ) and not rizatriptan  (Maxalt ) as needed for migraines? The original message stated that whatever she was taking, I assume sumatriptan , was not working?  Yes, okay to take magnesium and calcium .

## 2024-02-15 NOTE — Addendum Note (Signed)
 Addended by: Marnee Sherrard K on: 02/15/2024 01:40 PM   Modules accepted: Orders

## 2024-02-16 ENCOUNTER — Telehealth: Payer: Self-pay | Admitting: Primary Care

## 2024-02-16 NOTE — Telephone Encounter (Signed)
 Copied from CRM #[id number]. Topic: General - Other >> Feb 15, 2024  4:54 PM Zebedee SAUNDERS wrote: Reason for CRM:Pt returning Thompson, Janae, CMA call, pt hung up before getting back with her, Lvm to call back.

## 2024-02-17 NOTE — Telephone Encounter (Signed)
 Called and spoke with patient.  Relayed information provided. Pt verbalized understanding and repeated back to me  No questions or concerns. Will pick up topamax  from pharmacy.

## 2024-03-06 ENCOUNTER — Encounter: Payer: 59 | Admitting: Primary Care

## 2024-03-15 ENCOUNTER — Encounter: Admitting: Primary Care
